# Patient Record
Sex: Male | Born: 1985 | Race: White | Hispanic: No | Marital: Single | Smoking: Current every day smoker
Health system: Southern US, Community
[De-identification: ages and names within clinical notes are randomized; demographics above are authoritative.]

## PROBLEM LIST (undated history)

## (undated) DIAGNOSIS — I10 Essential (primary) hypertension: Secondary | ICD-10-CM

## (undated) DIAGNOSIS — F410 Panic disorder [episodic paroxysmal anxiety] without agoraphobia: Secondary | ICD-10-CM

## (undated) DIAGNOSIS — F419 Anxiety disorder, unspecified: Secondary | ICD-10-CM

---

## 2020-10-30 ENCOUNTER — Other Ambulatory Visit: Payer: Self-pay

## 2020-10-30 ENCOUNTER — Emergency Department (HOSPITAL_BASED_OUTPATIENT_CLINIC_OR_DEPARTMENT_OTHER): Payer: 59 | Admitting: Radiology

## 2020-10-30 ENCOUNTER — Emergency Department (HOSPITAL_BASED_OUTPATIENT_CLINIC_OR_DEPARTMENT_OTHER)
Admission: EM | Admit: 2020-10-30 | Discharge: 2020-10-31 | Disposition: A | Discharge status: Home / Self Care | Payer: 59 | Attending: Emergency Medicine | Admitting: Emergency Medicine

## 2020-10-30 ENCOUNTER — Encounter (HOSPITAL_BASED_OUTPATIENT_CLINIC_OR_DEPARTMENT_OTHER): Payer: Self-pay

## 2020-10-30 DIAGNOSIS — F410 Panic disorder [episodic paroxysmal anxiety] without agoraphobia: Secondary | ICD-10-CM | POA: Diagnosis not present

## 2020-10-30 DIAGNOSIS — R0602 Shortness of breath: Secondary | ICD-10-CM

## 2020-10-30 DIAGNOSIS — R062 Wheezing: Secondary | ICD-10-CM

## 2020-10-30 DIAGNOSIS — F17200 Nicotine dependence, unspecified, uncomplicated: Secondary | ICD-10-CM | POA: Diagnosis not present

## 2020-10-30 DIAGNOSIS — R002 Palpitations: Secondary | ICD-10-CM

## 2020-10-30 HISTORY — DX: Panic disorder (episodic paroxysmal anxiety): F41.0

## 2020-10-30 LAB — CBC
HCT: 46.5 % (ref 39.0–52.0)
Hemoglobin: 15.2 g/dL (ref 13.0–17.0)
MCH: 27.9 pg (ref 26.0–34.0)
MCHC: 32.7 g/dL (ref 30.0–36.0)
MCV: 85.3 fL (ref 80.0–100.0)
Platelets: 339 10*3/uL (ref 150–400)
RBC: 5.45 MIL/uL (ref 4.22–5.81)
RDW: 13.2 % (ref 11.5–15.5)
WBC: 12 10*3/uL — ABNORMAL HIGH (ref 4.0–10.5)
nRBC: 0 % (ref 0.0–0.2)

## 2020-10-30 LAB — URINALYSIS, ROUTINE W REFLEX MICROSCOPIC
Bilirubin Urine: NEGATIVE
Glucose, UA: NEGATIVE mg/dL
Hgb urine dipstick: NEGATIVE
Ketones, ur: NEGATIVE mg/dL
Leukocytes,Ua: NEGATIVE
Nitrite: NEGATIVE
Protein, ur: NEGATIVE mg/dL
Specific Gravity, Urine: 1.017 (ref 1.005–1.030)
pH: 6.5 (ref 5.0–8.0)

## 2020-10-30 LAB — BASIC METABOLIC PANEL
Anion gap: 8 (ref 5–15)
BUN: 12 mg/dL (ref 6–20)
CO2: 28 mmol/L (ref 22–32)
Calcium: 9.1 mg/dL (ref 8.9–10.3)
Chloride: 99 mmol/L (ref 98–111)
Creatinine, Ser: 0.9 mg/dL (ref 0.61–1.24)
GFR, Estimated: >60 mL/min (ref >60)
Glucose, Bld: 112 mg/dL — ABNORMAL HIGH (ref 70–99)
Potassium: 3.8 mmol/L (ref 3.5–5.1)
Sodium: 135 mmol/L (ref 135–145)

## 2020-10-30 LAB — CBG MONITORING, ED: Glucose-Capillary: 118 mg/dL — ABNORMAL HIGH (ref 70–99)

## 2020-10-30 MED ORDER — AEROCHAMBER PLUS FLO-VU MEDIUM MISC
1.0000 | Freq: Once | Status: AC
  Administered 2020-10-30: 1
  Filled 2020-10-30: qty 1

## 2020-10-30 MED ORDER — ALBUTEROL SULFATE HFA 108 (90 BASE) MCG/ACT IN AERS
2.0000 | INHALATION_SPRAY | Freq: Once | RESPIRATORY_TRACT | Status: AC
  Administered 2020-10-30: 2 via RESPIRATORY_TRACT
  Filled 2020-10-30: qty 6.7

## 2020-10-30 MED ORDER — ALBUTEROL SULFATE HFA 108 (90 BASE) MCG/ACT IN AERS: 2.0000 | INHALATION_SPRAY | RESPIRATORY_TRACT | Status: DC | PRN

## 2020-10-30 MED ORDER — PREDNISONE 50 MG PO TABS
50.0000 mg | ORAL_TABLET | Freq: Once | ORAL | Status: AC
  Administered 2020-10-30: 50 mg via ORAL
  Filled 2020-10-30: qty 1

## 2020-10-30 NOTE — ED Triage Notes (Signed)
Patient here POV from Home with Dizziness and SOB.  Patient has had 2-3 episodes of Dizziness and SOB recently. Patient had one episode today that was worse than the others.  Episodes last approximately 1 HR. Nothing precipitating prior to Episodes.   BIB Wheelchair, NAD, GCS 15.

## 2020-10-30 NOTE — ED Provider Notes (Signed)
[facility] EMERGENCY DEPT Provider Note   CSN: [id number] Arrival date & time: 10/30/20  1922     History Chief Complaint  Patient presents with  . Dizziness  . Shortness of Breath    Dean Cooper is a 35 y.o. male.  The history is provided by the patient, medical records and a friend. No language interpreter was used.  Shortness of Breath Severity:  Moderate Onset quality:  Sudden Duration:  4 days Timing:  Sporadic Progression:  Waxing and waning Chronicity:  New Context: pollens and weather changes   Context: not URI   Relieved by:  Nothing Worsened by:  Nothing Associated symptoms: cough, headaches and wheezing   Associated symptoms: no abdominal pain, no chest pain, no claudication, no diaphoresis, no fever, no neck pain, no rash, no sputum production, no syncope and no vomiting   Risk factors: obesity   Risk factors: no hx of PE/DVT        Past Medical History:  Diagnosis Date  . Panic disorder (episodic paroxysmal anxiety)     There are no problems to display for this patient.   History reviewed. No pertinent surgical history.     No family history on file.  Social History   Tobacco Use  . Smoking status: Current Every Day Smoker    Packs/day: 1.00  . Smokeless tobacco: Never Used  Substance Use Topics  . Alcohol use: Not Currently  . Drug use: Not Currently    Home Medications Prior to Admission medications   Not on File    Allergies    Patient has no allergy information on record.  Review of Systems   Review of Systems  Constitutional: Negative for chills, diaphoresis and fever.  HENT: Negative for congestion.   Eyes: Negative for visual disturbance.  Respiratory: Positive for cough, chest tightness, shortness of breath and wheezing. Negative for sputum production and stridor.   Cardiovascular: Positive for palpitations. Negative for chest pain, claudication, leg swelling and syncope.  Gastrointestinal: Negative for  abdominal pain, constipation, diarrhea, nausea and vomiting.  Genitourinary: Negative for flank pain.  Musculoskeletal: Negative for back pain and neck pain.  Skin: Negative for rash and wound.  Neurological: Positive for dizziness, light-headedness and headaches. Negative for weakness.  Psychiatric/Behavioral: Positive for agitation. The patient is nervous/anxious.   All other systems reviewed and are negative.   Physical Exam Updated Vital Signs BP 139/68   Pulse 79   Temp 98.1 F (36.7 C) (Oral)   Resp 18   Ht 5\' 11"  (1.803 m)   Wt 127 kg   SpO2 98%   BMI 39.05 kg/m   Physical Exam Vitals and nursing note reviewed.  Constitutional:      General: He is not in acute distress.    Appearance: He is well-developed. He is not ill-appearing, toxic-appearing or diaphoretic.  HENT:     Head: Normocephalic and atraumatic.     Mouth/Throat:     Mouth: Mucous membranes are moist.     Pharynx: No pharyngeal swelling or oropharyngeal exudate.  Eyes:     Extraocular Movements: Extraocular movements intact.     Conjunctiva/sclera: Conjunctivae normal.     Pupils: Pupils are equal, round, and reactive to light.  Cardiovascular:     Rate and Rhythm: Normal rate and regular rhythm.  No extrasystoles are present.    Pulses: Normal pulses.     Heart sounds: No murmur heard.   Pulmonary:     Effort: Pulmonary effort is normal. No  tachypnea or respiratory distress.     Breath sounds: Wheezing present. No decreased breath sounds, rhonchi or rales.  Chest:     Chest wall: No tenderness.  Abdominal:     Palpations: Abdomen is soft.     Tenderness: There is no abdominal tenderness.  Musculoskeletal:     Cervical back: Neck supple.     Right lower leg: No tenderness. No edema.     Left lower leg: No tenderness. No edema.  Skin:    General: Skin is warm and dry.     Capillary Refill: Capillary refill takes less than 2 seconds.     Findings: No erythema.  Neurological:     General: No  focal deficit present.     Mental Status: He is alert.  Psychiatric:        Mood and Affect: Mood is anxious.     ED Results / Procedures / Treatments   Labs (all labs ordered are listed, but only abnormal results are displayed) Labs Reviewed  BASIC METABOLIC PANEL - Abnormal; Notable for the following components:      Result Value   Glucose, Bld 112 (*)    All other components within normal limits  CBC - Abnormal; Notable for the following components:   WBC 12.0 (*)    All other components within normal limits  CBG MONITORING, ED - Abnormal; Notable for the following components:   Glucose-Capillary 118 (*)    All other components within normal limits  URINALYSIS, ROUTINE W REFLEX MICROSCOPIC    EKG EKG Interpretation  Date/Time:  Thursday Oct 30 2020 20:03:46 EDT Ventricular Rate:  85 PR Interval:  156 QRS Duration: 96 QT Interval:  358 QTC Calculation: 426 R Axis:   84 Text Interpretation: Normal sinus rhythm Normal ECG No prior ECG for comparison. No STEMI Confirmed by Theda Belfast ([id number]) on 10/30/2020 8:21:51 PM   Radiology DG Chest 2 View  Result Date: 10/30/2020 CLINICAL DATA:  Shortness of breath EXAM: CHEST - 2 VIEW COMPARISON:  None. FINDINGS: The heart size and mediastinal contours are within normal limits. Both lungs are clear. The visualized skeletal structures are unremarkable. IMPRESSION: No active cardiopulmonary disease. Electronically Signed   By: Jasmine Pang M.D.   On: 10/30/2020 20:01    Procedures Procedures   Medications Ordered in ED Medications - No data to display  ED Course  I have reviewed the triage vital signs and the nursing notes.  Pertinent labs & imaging results that were available during my care of the patient were reviewed by me and considered in my medical decision making (see chart for details).    MDM Rules/Calculators/A&P                          Dean Cooper is a 35 y.o. male  with a past medical history significant  for remote history of asthma and and episodic paroxysmal anxiety/panic disorder who presents with episodes of wheezing, shortness of breath, and episodes of dizziness, lightheadedness, palpitations, and panic.  He reports that the symptoms of been ongoing for the last few days worsening and seem to be triggered by difficulty breathing.  He says that he thought he outgrew his asthma but with the seasonal changes has had worsened breathing and wheezing.  Denies any chest pain but does report some palpitations.  Reports feeling very anxious and having panic.  Reports some headaches at times but denies any focal neurologic deficits.  He reports  severe anxiety and panic and has not been on any medications for this.  He is scheduled to see his PCP in several weeks but was concerned he needed to be evaluated with these episodes.  He denies any syncope but reports the dizziness and lightheadedness has had him when he is very anxious and worked up with the breathing.  Denies any trauma.  Denies any neck pain or neck stiffness or other complaints.  On exam, lungs have wheezing and chest is nontender.  Abdomen and back are nontender.  No focal neurologic deficits.  Pupils symmetric and reactive normal extraocular movements.  No abnormalities with finger-nose-finger testing.  He is very anxious and jittery.  EKG shows no STEMI.  Vital signs reassuring.  Due to the wheezing, patient will be given some albuterol and steroids.  Clinically I suspect that his reactive airway disease history has been reengaged with the allergies and temperature changes over the last week or so.  I think that the breathing difficulties has caused him to have these episodes of panic attacks as he is worried about his breathing.  Patient reports he does not want any anxiety medicine in the emergency department.  Patient reassessed and is breathing and feeling much better.  He is no longer feeling anxious and his vital signs are reassuring.   Wheezing has resolved with the steroids and albuterol.  Patient will take the albuterol home and will give a prescription for a burst of steroids.  He will follow-up with his PCP in close follow-up and discuss further anxiety management.  Due to the panic attacks, will give a short prescription for Ativan in case he needs a rescue medication.  He otherwise well-appearing.  We offered CT head due to the dizziness and symptoms but have a low suspicion for intracranial abnormality.  Patient agrees to hold on imaging or further work-up at this time.  Given resolution of symptoms and well appearance for several hours, we feel he safe for discharge home.  Patient discharged in good condition with resolution of symptoms.     Final Clinical Impression(s) / ED Diagnoses Final diagnoses:  Shortness of breath  Wheezing  Anxiety attack  Palpitations    Rx / DC Orders ED Discharge Orders         Ordered    predniSONE (DELTASONE) 50 MG tablet  Daily with breakfast        10/31/20 0026    LORazepam (ATIVAN) 1 MG tablet  3 times daily PRN        10/31/20 0026          Clinical Impression: 1. Shortness of breath   2. Wheezing   3. Anxiety attack   4. Palpitations     Disposition: Discharge  Condition: Good  I have discussed the results, Dx and Tx plan with the pt(& family if present). He/she/they expressed understanding and agree(s) with the plan. Discharge instructions discussed at great length. Strict return precautions discussed and pt &/or family have verbalized understanding of the instructions. No further questions at time of discharge.    Discharge Medication List as of 10/31/2020 12:27 AM    START taking these medications   Details  LORazepam (ATIVAN) 1 MG tablet Take 1 tablet (1 mg total) by mouth 3 (three) times daily as needed for anxiety., Starting Fri 10/31/2020, Print    predniSONE (DELTASONE) 50 MG tablet Take 1 tablet (50 mg total) by mouth daily with breakfast for 4  days., Starting Fri 10/31/2020, Until Tue 11/04/2020,  Print        Follow Up: your PCP     [facility] [address] [place] [id number] [phone]       Almena Hokenson, Canary Brimhristopher J, MD 10/31/20 [phone]

## 2020-10-30 NOTE — ED Notes (Signed)
RT educated pt on proper use of aero chamber. Pt able to teach back to RT proper use of MDI/aero chamber without difficulty.

## 2020-10-31 MED ORDER — LORAZEPAM 1 MG PO TABS: 1.0000 mg | ORAL_TABLET | Freq: Three times a day (TID) | ORAL | 0 refills | Status: AC | PRN

## 2020-10-31 MED ORDER — PREDNISONE 50 MG PO TABS: 50.0000 mg | ORAL_TABLET | Freq: Every day | ORAL | 0 refills | Status: AC

## 2020-10-31 NOTE — Discharge Instructions (Signed)
Your history and exam today are consistent with some reactive airway disease likely asthma being exacerbated by the seasonal changes and pollens leading to anxiety and panic attacks.  Your exam was overall reassuring and after the albuterol and steroids your breathing has improved.  Given your stability for over 5 hours in the emergency department, we feel you are safe for discharge home and do not need further work-up at this time.  Please call your primary doctor tomorrow to discuss this close follow-up.  Please rest and stay hydrated.  Please use the anxiety medicine if you feel a panic attack coming on.  If any symptoms change or worsen acutely, please return to the nearest emergency department.

## 2020-11-19 ENCOUNTER — Emergency Department (HOSPITAL_BASED_OUTPATIENT_CLINIC_OR_DEPARTMENT_OTHER): Payer: 59

## 2020-11-19 ENCOUNTER — Other Ambulatory Visit: Payer: Self-pay

## 2020-11-19 ENCOUNTER — Inpatient Hospital Stay (HOSPITAL_BASED_OUTPATIENT_CLINIC_OR_DEPARTMENT_OTHER)
Admission: EM | Admit: 2020-11-19 | Discharge: 2020-11-23 | DRG: 872 | Disposition: A | Discharge status: Home / Self Care | Payer: 59 | Attending: Internal Medicine | Admitting: Internal Medicine

## 2020-11-19 ENCOUNTER — Encounter (HOSPITAL_BASED_OUTPATIENT_CLINIC_OR_DEPARTMENT_OTHER): Payer: Self-pay | Admitting: Emergency Medicine

## 2020-11-19 DIAGNOSIS — Z20822 Contact with and (suspected) exposure to covid-19: Secondary | ICD-10-CM | POA: Diagnosis present

## 2020-11-19 DIAGNOSIS — E861 Hypovolemia: Secondary | ICD-10-CM | POA: Diagnosis present

## 2020-11-19 DIAGNOSIS — R509 Fever, unspecified: Principal | ICD-10-CM

## 2020-11-19 DIAGNOSIS — Z79899 Other long term (current) drug therapy: Secondary | ICD-10-CM

## 2020-11-19 DIAGNOSIS — A419 Sepsis, unspecified organism: Secondary | ICD-10-CM | POA: Diagnosis not present

## 2020-11-19 DIAGNOSIS — Z833 Family history of diabetes mellitus: Secondary | ICD-10-CM

## 2020-11-19 DIAGNOSIS — F17210 Nicotine dependence, cigarettes, uncomplicated: Secondary | ICD-10-CM | POA: Diagnosis present

## 2020-11-19 DIAGNOSIS — Z6841 Body Mass Index (BMI) 40.0 and over, adult: Secondary | ICD-10-CM

## 2020-11-19 DIAGNOSIS — G039 Meningitis, unspecified: Secondary | ICD-10-CM

## 2020-11-19 DIAGNOSIS — E871 Hypo-osmolality and hyponatremia: Secondary | ICD-10-CM | POA: Diagnosis present

## 2020-11-19 DIAGNOSIS — R109 Unspecified abdominal pain: Secondary | ICD-10-CM | POA: Diagnosis present

## 2020-11-19 DIAGNOSIS — R197 Diarrhea, unspecified: Secondary | ICD-10-CM | POA: Diagnosis present

## 2020-11-19 DIAGNOSIS — E876 Hypokalemia: Secondary | ICD-10-CM | POA: Diagnosis present

## 2020-11-19 DIAGNOSIS — F32A Depression, unspecified: Secondary | ICD-10-CM | POA: Diagnosis present

## 2020-11-19 DIAGNOSIS — R519 Headache, unspecified: Secondary | ICD-10-CM

## 2020-11-19 DIAGNOSIS — F419 Anxiety disorder, unspecified: Secondary | ICD-10-CM | POA: Diagnosis present

## 2020-11-19 DIAGNOSIS — E669 Obesity, unspecified: Secondary | ICD-10-CM | POA: Diagnosis present

## 2020-11-19 DIAGNOSIS — R110 Nausea: Secondary | ICD-10-CM | POA: Diagnosis present

## 2020-11-19 HISTORY — DX: Anxiety disorder, unspecified: F41.9

## 2020-11-19 LAB — CBG MONITORING, ED: Glucose-Capillary: 114 mg/dL — ABNORMAL HIGH (ref 70–99)

## 2020-11-19 LAB — COMPREHENSIVE METABOLIC PANEL
ALT: 29 U/L (ref 0–44)
AST: 18 U/L (ref 15–41)
Albumin: 3.8 g/dL (ref 3.5–5.0)
Alkaline Phosphatase: 36 U/L — ABNORMAL LOW (ref 38–126)
Anion gap: 11 (ref 5–15)
BUN: 10 mg/dL (ref 6–20)
CO2: 23 mmol/L (ref 22–32)
Calcium: 8.4 mg/dL — ABNORMAL LOW (ref 8.9–10.3)
Chloride: 97 mmol/L — ABNORMAL LOW (ref 98–111)
Creatinine, Ser: 1.07 mg/dL (ref 0.61–1.24)
GFR, Estimated: >60 mL/min (ref >60)
Glucose, Bld: 102 mg/dL — ABNORMAL HIGH (ref 70–99)
Potassium: 3.4 mmol/L — ABNORMAL LOW (ref 3.5–5.1)
Sodium: 131 mmol/L — ABNORMAL LOW (ref 135–145)
Total Bilirubin: 1.1 mg/dL (ref 0.3–1.2)
Total Protein: 7.1 g/dL (ref 6.5–8.1)

## 2020-11-19 LAB — URINALYSIS, ROUTINE W REFLEX MICROSCOPIC
Bilirubin Urine: NEGATIVE
Glucose, UA: NEGATIVE mg/dL
Hgb urine dipstick: NEGATIVE
Ketones, ur: 15 mg/dL — AB
Leukocytes,Ua: NEGATIVE
Nitrite: NEGATIVE
Protein, ur: 30 mg/dL — AB
Specific Gravity, Urine: 1.023 (ref 1.005–1.030)
pH: 6.5 (ref 5.0–8.0)

## 2020-11-19 LAB — CBC WITH DIFFERENTIAL/PLATELET
Abs Immature Granulocytes: 0.1 10*3/uL — ABNORMAL HIGH (ref 0.00–0.07)
Basophils Absolute: 0 10*3/uL (ref 0.0–0.1)
Basophils Relative: 0 %
Eosinophils Absolute: 0 10*3/uL (ref 0.0–0.5)
Eosinophils Relative: 0 %
HCT: 41.8 % (ref 39.0–52.0)
Hemoglobin: 14.1 g/dL (ref 13.0–17.0)
Immature Granulocytes: 1 %
Lymphocytes Relative: 7 %
Lymphs Abs: 1.3 10*3/uL (ref 0.7–4.0)
MCH: 28.1 pg (ref 26.0–34.0)
MCHC: 33.7 g/dL (ref 30.0–36.0)
MCV: 83.3 fL (ref 80.0–100.0)
Monocytes Absolute: 1.5 10*3/uL — ABNORMAL HIGH (ref 0.1–1.0)
Monocytes Relative: 8 %
Neutro Abs: 16.4 10*3/uL — ABNORMAL HIGH (ref 1.7–7.7)
Neutrophils Relative %: 84 %
Platelets: 298 10*3/uL (ref 150–400)
RBC: 5.02 MIL/uL (ref 4.22–5.81)
RDW: 13.4 % (ref 11.5–15.5)
WBC: 19.2 10*3/uL — ABNORMAL HIGH (ref 4.0–10.5)
nRBC: 0 % (ref 0.0–0.2)

## 2020-11-19 LAB — RESP PANEL BY RT-PCR (FLU A&B, COVID) ARPGX2
Influenza A by PCR: NEGATIVE
Influenza B by PCR: NEGATIVE
SARS Coronavirus 2 by RT PCR: NEGATIVE

## 2020-11-19 LAB — LACTIC ACID, PLASMA: Lactic Acid, Venous: 0.6 mmol/L (ref 0.5–1.9)

## 2020-11-19 MED ORDER — KETOROLAC TROMETHAMINE 30 MG/ML IJ SOLN
30.0000 mg | Freq: Once | INTRAMUSCULAR | Status: AC
  Administered 2020-11-19: 30 mg via INTRAVENOUS
  Filled 2020-11-19: qty 1

## 2020-11-19 MED ORDER — SODIUM CHLORIDE 0.9 % IV SOLN
2.0000 g | Freq: Once | INTRAVENOUS | Status: AC
  Administered 2020-11-19: 2 g via INTRAVENOUS
  Filled 2020-11-19: qty 20

## 2020-11-19 MED ORDER — VANCOMYCIN HCL 1250 MG/250ML IV SOLN
1250.0000 mg | Freq: Three times a day (TID) | INTRAVENOUS | Status: DC
  Administered 2020-11-20 – 2020-11-21 (×5): 1250 mg via INTRAVENOUS
  Filled 2020-11-19 (×7): qty 250

## 2020-11-19 MED ORDER — VANCOMYCIN HCL IN DEXTROSE 1-5 GM/200ML-% IV SOLN
1000.0000 mg | Freq: Once | INTRAVENOUS | Status: AC
  Administered 2020-11-19: 1000 mg via INTRAVENOUS
  Filled 2020-11-19: qty 200

## 2020-11-19 MED ORDER — ONDANSETRON HCL 4 MG/2ML IJ SOLN
INTRAMUSCULAR | Status: AC
  Filled 2020-11-19: qty 2

## 2020-11-19 MED ORDER — SODIUM CHLORIDE 0.9 % IV BOLUS
500.0000 mL | Freq: Once | INTRAVENOUS | Status: AC
  Administered 2020-11-19: 500 mL via INTRAVENOUS

## 2020-11-19 MED ORDER — ONDANSETRON HCL 4 MG/2ML IJ SOLN
4.0000 mg | Freq: Once | INTRAMUSCULAR | Status: AC
  Administered 2020-11-19: 4 mg via INTRAVENOUS

## 2020-11-19 MED ORDER — IBUPROFEN 800 MG PO TABS
800.0000 mg | ORAL_TABLET | Freq: Once | ORAL | Status: AC
  Administered 2020-11-19: 800 mg via ORAL
  Filled 2020-11-19: qty 1

## 2020-11-19 MED ORDER — ACETAMINOPHEN 325 MG PO TABS
650.0000 mg | ORAL_TABLET | Freq: Once | ORAL | Status: AC | PRN
  Administered 2020-11-19: 650 mg via ORAL
  Filled 2020-11-19: qty 2

## 2020-11-19 MED ORDER — LORAZEPAM 2 MG/ML IJ SOLN
1.0000 mg | Freq: Once | INTRAMUSCULAR | Status: AC
  Administered 2020-11-19: 1 mg via INTRAVENOUS
  Filled 2020-11-19: qty 1

## 2020-11-19 NOTE — ED Provider Notes (Signed)
[facility] EMERGENCY DEPT Provider Note   CSN: [id number] Arrival date & time: 11/19/20  1514     History Chief Complaint  Patient presents with  . Fever    Dean Cooper is a 35 y.o. male w/ no significant past medical history presenting to ED with fever, chills, congestion, cough, body aches, diarrhea for 3 days.  It started on Monday with soreness in his upper back and lower neck.  It is progressed to a throbbing headache, diffuse cramping abdominal pain, change in taste, loss of appetite, diarrhea, and muscle cramping.  Reports home temp as high as 103F.  He is only taken Tylenol with some minimal relief at home for the symptoms.  There are no sick contacts in the house.  He reports he has had 3 doses of the COVID-vaccine.  He denies any other significant medical history aside from panic disorder and anxiety, for which he takes an SSRI.  No surgical history.  No foreign travel.  No sick contacts in house (lives with his partner).  HPI     Past Medical History:  Diagnosis Date  . Anxiety   . Panic disorder (episodic paroxysmal anxiety)     Patient Active Problem List   Diagnosis Date Noted  . Meningitis 11/19/2020    History reviewed. No pertinent surgical history.     History reviewed. No pertinent family history.  Social History   Tobacco Use  . Smoking status: Current Every Day Smoker    Packs/day: 1.00  . Smokeless tobacco: Never Used  Substance Use Topics  . Alcohol use: Not Currently  . Drug use: Not Currently    Home Medications Prior to Admission medications   Medication Sig Start Date End Date Taking? Authorizing Provider  escitalopram (LEXAPRO) 10 MG tablet Take 1 tablet by mouth daily. 11/10/20  Yes [provider]  LORazepam (ATIVAN) 1 MG tablet Take 1 tablet (1 mg total) by mouth 3 (three) times daily as needed for anxiety. 10/31/20  Yes Tegeler, Canary Brim, MD    Allergies    Patient has no known allergies.  Review of  Systems   Review of Systems  Constitutional: Positive for appetite change, chills, fatigue and fever.  HENT: Negative for ear pain and sore throat.   Eyes: Positive for pain and redness.  Respiratory: Positive for shortness of breath. Negative for cough.   Cardiovascular: Negative for chest pain and palpitations.  Gastrointestinal: Positive for abdominal pain, diarrhea and nausea. Negative for vomiting.  Genitourinary: Negative for dysuria and hematuria.  Musculoskeletal: Positive for arthralgias and myalgias.  Skin: Negative for color change and rash.  Neurological: Positive for headaches. Negative for syncope.  All other systems reviewed and are negative.   Physical Exam Updated Vital Signs BP 135/76   Pulse (!) 101   Temp 99.6 F (37.6 C) (Oral)   Resp 20   Ht 5\' 11"  (1.803 m)   Wt 131.5 kg   SpO2 94%   BMI 40.45 kg/m   Physical Exam Constitutional:      General: He is not in acute distress.    Appearance: He is obese.  HENT:     Head: Normocephalic and atraumatic.  Eyes:     Pupils: Pupils are equal, round, and reactive to light.     Comments: Mild injected scleral bilaterally  Neck:     Comments: Negative kernig & brudzinski signs Cardiovascular:     Rate and Rhythm: Regular rhythm. Tachycardia present.     Pulses: Normal  pulses.     Comments: HR 120 bpm Pulmonary:     Effort: Pulmonary effort is normal. No respiratory distress.  Abdominal:     General: There is no distension.     Palpations: There is no mass.     Tenderness: There is no abdominal tenderness. There is no guarding.  Musculoskeletal:     Cervical back: Normal range of motion and neck supple. No rigidity.  Skin:    General: Skin is warm and dry.  Neurological:     General: No focal deficit present.     Mental Status: He is alert and oriented to person, place, and time. Mental status is at baseline.  Psychiatric:        Mood and Affect: Mood normal.        Behavior: Behavior normal.      ED Results / Procedures / Treatments   Labs (all labs ordered are listed, but only abnormal results are displayed) Labs Reviewed  CBC WITH DIFFERENTIAL/PLATELET - Abnormal; Notable for the following components:      Result Value   WBC 19.2 (*)    Neutro Abs 16.4 (*)    Monocytes Absolute 1.5 (*)    Abs Immature Granulocytes 0.10 (*)    All other components within normal limits  COMPREHENSIVE METABOLIC PANEL - Abnormal; Notable for the following components:   Sodium 131 (*)    Potassium 3.4 (*)    Chloride 97 (*)    Glucose, Bld 102 (*)    Calcium 8.4 (*)    Alkaline Phosphatase 36 (*)    All other components within normal limits  URINALYSIS, ROUTINE W REFLEX MICROSCOPIC - Abnormal; Notable for the following components:   Ketones, ur 15 (*)    Protein, ur 30 (*)    All other components within normal limits  CBG MONITORING, ED - Abnormal; Notable for the following components:   Glucose-Capillary 114 (*)    All other components within normal limits  RESP PANEL BY RT-PCR (FLU A&B, COVID) ARPGX2  CULTURE, BLOOD (ROUTINE X 2)  CULTURE, BLOOD (ROUTINE X 2)  URINE CULTURE  CSF CULTURE W GRAM STAIN  GRAM STAIN  LACTIC ACID, PLASMA  CSF CELL COUNT WITH DIFFERENTIAL  CSF CELL COUNT WITH DIFFERENTIAL  GLUCOSE, CSF  PROTEIN, CSF  HSV 1/2 PCR, CSF    EKG EKG Interpretation  Date/Time:  Wednesday November 19 2020 17:06:54 EDT Ventricular Rate:  97 PR Interval:  147 QRS Duration: 111 QT Interval:  343 QTC Calculation: 436 R Axis:   87 Text Interpretation: Sinus rhythm RSR' in V1 or V2, right VCD or RVH Baseline wander in lead(s) II III aVF V4 V5 Confirmed by Alvester Chou [phone]) on 11/19/2020 5:09:03 PM   Radiology DG Chest Portable 1 View  Result Date: 11/19/2020 CLINICAL DATA:  Fever since Monday, congestion, chills, body aches, runny nose, diarrhea, abdominal pain, neck stiffness, T-max 103 degrees EXAM: PORTABLE CHEST 1 VIEW COMPARISON:  Portable exam 1600 hours  compared to 10/30/2020 FINDINGS: Normal heart size, mediastinal contours, and pulmonary vascularity. Lungs clear. No pulmonary infiltrate, pleural effusion, or pneumothorax. Osseous structures unremarkable. IMPRESSION: No acute abnormalities. Electronically Signed   By: Ulyses Southward M.D.   On: 11/19/2020 16:10    Procedures .Critical Care Performed by: Terald Sleeper, MD Authorized by: Terald Sleeper, MD   Critical care provider statement:    Critical care time (minutes):  45   Critical care was necessary to treat or prevent imminent or life-threatening deterioration  of the following conditions:  Sepsis   Critical care was time spent personally by me on the following activities:  Discussions with consultants, evaluation of patient's response to treatment, examination of patient, ordering and performing treatments and interventions, ordering and review of laboratory studies, ordering and review of radiographic studies, pulse oximetry, re-evaluation of patient's condition, obtaining history from patient or surrogate and review of old charts .Lumbar Puncture  Date/Time: 11/20/2020 12:14 AM Performed by: Terald Sleeper, MD Authorized by: Terald Sleeper, MD   Consent:    Consent obtained:  Verbal   Consent given by:  Patient   Risks, benefits, and alternatives were discussed: yes     Risks discussed:  Headache, nerve damage, repeat procedure, pain, infection and bleeding   Alternatives discussed:  Delayed treatment Universal protocol:    Procedure explained and questions answered to patient or proxy's satisfaction: yes     Relevant documents present and verified: yes     Test results available: yes     Imaging studies available: yes     Required blood products, implants, devices, and special equipment available: yes     Immediately prior to procedure a time out was called: yes     Site/side marked: yes     Patient identity confirmed:  Arm band Pre-procedure details:    Procedure  purpose:  Diagnostic   Preparation: Patient was prepped and draped in usual sterile fashion   Anesthesia:    Anesthesia method:  Local infiltration   Local anesthetic:  Lidocaine 1% w/o epi Procedure details:    Lumbar space:  L4-L5 interspace   Patient position:  Sitting   Needle gauge:  20   Needle length (in):  3.5   Ultrasound guidance: no     Number of attempts:  2 Post-procedure details:    Puncture site:  Adhesive bandage applied   Procedure completion:  Tolerated well, no immediate complications Comments:     Unable to obtain spinal fluid     Medications Ordered in ED Medications  vancomycin (VANCOREADY) IVPB 1250 mg/250 mL (has no administration in time range)  acetaminophen (TYLENOL) tablet 650 mg (650 mg Oral Given 11/19/20 1531)  ibuprofen (ADVIL) tablet 800 mg (800 mg Oral Given 11/19/20 1604)  sodium chloride 0.9 % bolus 500 mL (0 mLs Intravenous Stopped 11/19/20 1643)  LORazepam (ATIVAN) injection 1 mg (1 mg Intravenous Given 11/19/20 1940)  cefTRIAXone (ROCEPHIN) 2 g in sodium chloride 0.9 % 100 mL IVPB (0 g Intravenous Stopped 11/19/20 2100)  vancomycin (VANCOCIN) IVPB 1000 mg/200 mL premix (0 mg Intravenous Stopped 11/19/20 2205)    Followed by  vancomycin (VANCOCIN) IVPB 1000 mg/200 mL premix (1,000 mg Intravenous New Bag/Given 11/19/20 2205)  ondansetron (ZOFRAN) injection 4 mg (4 mg Intravenous Given 11/19/20 2112)  ketorolac (TORADOL) 30 MG/ML injection 30 mg (30 mg Intravenous Given 11/19/20 2217)    ED Course  I have reviewed the triage vital signs and the nursing notes.  Pertinent labs & imaging results that were available during my care of the patient were reviewed by me and considered in my medical decision making (see chart for details).   DDx includes viral illness vs bacterial infection vs other  His combination of symptoms suggested a viral infection.  I ordered covid/flu which returned negative.  Subsequently his las revealed WBC 19K with left shift.  Lactate  normal.    With fever, tachycardia, leukocytosis, I initiated a sepsis workup.  No abdominal complaints or tenderness to suggest abdominal source  of fever.  LFT's normal.  Doubt acute biliary disease at this time.  Dg chest and UA without evidence of infection.  His initial complaint was neck stiffness and fever at the start of symptoms.  He has a mild diffuse headache.  We discussed a workup for meningitis, and I attempted a bedside LP unsuccessfully.  I ordered IV vanco + rocephin empirically, and we will plan for medical admission.  He remains clinically well appearing at this time - no encephalopathy.    Doubt severe sepsis or septic shock. His headache improved with motrin but then returned, along with nausea and 1 episode vomiting.  IV zofran also given.   Dean Cooper was evaluated in Emergency Department on 11/20/2020 for the symptoms described in the history of present illness. He was evaluated in the context of the global COVID-19 pandemic, which necessitated consideration that the patient might be at risk for infection with the SARS-CoV-2 virus that causes COVID-19. Institutional protocols and algorithms that pertain to the evaluation of patients at risk for COVID-19 are in a state of rapid change based on information released by regulatory bodies including the CDC and federal and state organizations. These policies and algorithms were followed during the patient's care in the ED.  Clinical Course as of 11/20/20 0016  Wed Nov 19, 2020  1816 Lactic Acid, Venous: 0.6 [MT]  2021 Unsuccessful LP limited by body habitus - no CSF return on 2 attempts.  We will initiate IV antibiotics and admit for sepsis/meningitis evaluation, may need fluoroscopic LP at a later time.   [MT]  2104 Admitted to hospitalist.  I've spoken to Dr Manson PasseyBrown from radiology [facility] Porteous(Fluoro) on call tonight, who reports that [facility] and [facility] is emergent need for LP, their team will be able to assist.   Otherwise routine we will be happy to assist tomorrow morning with fluoroscopically guided LP.   [MT]    Clinical Course User Index [MT] Aiyana Stegmann, Kermit BaloMatthew J, MD    Final Clinical Impression(s) / ED Diagnoses Final diagnoses:  Fever, unspecified fever cause  Nonintractable headache, unspecified chronicity pattern, unspecified headache type    Rx / DC Orders ED Discharge Orders    None       Terald Sleeperrifan, Lyric Rossano J, MD 11/20/20 [phone]

## 2020-11-19 NOTE — ED Notes (Signed)
Pt has signed the procedural consent for LP.

## 2020-11-19 NOTE — ED Triage Notes (Signed)
Pt arrives to ED with c/o fever since Monday (x3 days). Pt reports congestion, chills, body aches, neck stiffness, runny nose, diarrhea, and abdominal pain. Highest recorded temp at home was 103. OTC tylenol w/o relief.

## 2020-11-19 NOTE — Progress Notes (Signed)
Pharmacy Antibiotic Note  Dean Cooper is a 35 y.o. male admitted on 11/19/2020 presenting with fever/chills, HA and concern for meningitis.  Pharmacy has been consulted for vancomycin dosing.  Ceftriaxone per MD  Plan: Vancomycin 2000 mg IV x 1, then 1250 mg IV q8h (target vancomycin trough 15-20) Monitor renal function, Cx/LP and meningitis workup Vancomycin levels as needed  Height: 5\' 11"  (180.3 cm) Weight: 131.5 kg (290 lb) IBW/kg (Calculated) : 75.3  Temp (24hrs), Avg:99.9 F (37.7 C), Min:98.6 F (37 C), Max:101.1 F (38.4 C)  Recent Labs  Lab 11/19/20 1613 11/19/20 1653  WBC 19.2*  --   CREATININE  --  1.07  LATICACIDVEN  --  0.6    Estimated Creatinine Clearance: 133.3 mL/min (by C-G formula based on SCr of 1.07 mg/dL).    No Known Allergies  01/19/21, PharmD Clinical Pharmacist ED Pharmacist Phone # [phone] 11/19/2020 9:10 PM

## 2020-11-19 NOTE — ED Notes (Signed)
Pt aware of the need for an LP. Explained procedure to pt who verbalizes understanding.

## 2020-11-20 ENCOUNTER — Observation Stay (HOSPITAL_COMMUNITY): Payer: 59

## 2020-11-20 DIAGNOSIS — Z20822 Contact with and (suspected) exposure to covid-19: Secondary | ICD-10-CM | POA: Diagnosis not present

## 2020-11-20 DIAGNOSIS — F17210 Nicotine dependence, cigarettes, uncomplicated: Secondary | ICD-10-CM | POA: Diagnosis not present

## 2020-11-20 DIAGNOSIS — R109 Unspecified abdominal pain: Secondary | ICD-10-CM | POA: Diagnosis not present

## 2020-11-20 DIAGNOSIS — R110 Nausea: Secondary | ICD-10-CM | POA: Diagnosis not present

## 2020-11-20 DIAGNOSIS — Z833 Family history of diabetes mellitus: Secondary | ICD-10-CM | POA: Diagnosis not present

## 2020-11-20 DIAGNOSIS — Z79899 Other long term (current) drug therapy: Secondary | ICD-10-CM | POA: Diagnosis not present

## 2020-11-20 DIAGNOSIS — E861 Hypovolemia: Secondary | ICD-10-CM | POA: Diagnosis not present

## 2020-11-20 DIAGNOSIS — G039 Meningitis, unspecified: Secondary | ICD-10-CM

## 2020-11-20 DIAGNOSIS — Z6841 Body Mass Index (BMI) 40.0 and over, adult: Secondary | ICD-10-CM | POA: Diagnosis not present

## 2020-11-20 DIAGNOSIS — R509 Fever, unspecified: Secondary | ICD-10-CM | POA: Diagnosis present

## 2020-11-20 DIAGNOSIS — F419 Anxiety disorder, unspecified: Secondary | ICD-10-CM | POA: Diagnosis not present

## 2020-11-20 DIAGNOSIS — R197 Diarrhea, unspecified: Secondary | ICD-10-CM | POA: Diagnosis not present

## 2020-11-20 DIAGNOSIS — E871 Hypo-osmolality and hyponatremia: Secondary | ICD-10-CM | POA: Diagnosis not present

## 2020-11-20 DIAGNOSIS — F32A Depression, unspecified: Secondary | ICD-10-CM | POA: Diagnosis not present

## 2020-11-20 DIAGNOSIS — A419 Sepsis, unspecified organism: Secondary | ICD-10-CM | POA: Diagnosis not present

## 2020-11-20 DIAGNOSIS — E669 Obesity, unspecified: Secondary | ICD-10-CM | POA: Diagnosis not present

## 2020-11-20 DIAGNOSIS — E876 Hypokalemia: Secondary | ICD-10-CM | POA: Diagnosis not present

## 2020-11-20 DIAGNOSIS — R519 Headache, unspecified: Secondary | ICD-10-CM

## 2020-11-20 LAB — COMPREHENSIVE METABOLIC PANEL
ALT: 33 U/L (ref 0–44)
AST: 19 U/L (ref 15–41)
Albumin: 3.1 g/dL — ABNORMAL LOW (ref 3.5–5.0)
Alkaline Phosphatase: 61 U/L (ref 38–126)
Anion gap: 8 (ref 5–15)
BUN: 10 mg/dL (ref 6–20)
CO2: 28 mmol/L (ref 22–32)
Calcium: 8.5 mg/dL — ABNORMAL LOW (ref 8.9–10.3)
Chloride: 97 mmol/L — ABNORMAL LOW (ref 98–111)
Creatinine, Ser: 1.26 mg/dL — ABNORMAL HIGH (ref 0.61–1.24)
GFR, Estimated: >60 mL/min (ref >60)
Glucose, Bld: 104 mg/dL — ABNORMAL HIGH (ref 70–99)
Potassium: 3.6 mmol/L (ref 3.5–5.1)
Sodium: 133 mmol/L — ABNORMAL LOW (ref 135–145)
Total Bilirubin: 1.2 mg/dL (ref 0.3–1.2)
Total Protein: 7.3 g/dL (ref 6.5–8.1)

## 2020-11-20 LAB — PROTEIN, CSF: Total  Protein, CSF: 27 mg/dL (ref 15–45)

## 2020-11-20 LAB — CSF CELL COUNT WITH DIFFERENTIAL
RBC Count, CSF: 33 /mm3 — ABNORMAL HIGH
Tube #: 3
WBC, CSF: 3 /mm3 (ref 0–5)

## 2020-11-20 LAB — CBC
HCT: 43.4 % (ref 39.0–52.0)
Hemoglobin: 14 g/dL (ref 13.0–17.0)
MCH: 27.8 pg (ref 26.0–34.0)
MCHC: 32.3 g/dL (ref 30.0–36.0)
MCV: 86.1 fL (ref 80.0–100.0)
Platelets: 282 10*3/uL (ref 150–400)
RBC: 5.04 MIL/uL (ref 4.22–5.81)
RDW: 13.3 % (ref 11.5–15.5)
WBC: 16.4 10*3/uL — ABNORMAL HIGH (ref 4.0–10.5)
nRBC: 0 % (ref 0.0–0.2)

## 2020-11-20 LAB — PHOSPHORUS: Phosphorus: 3.2 mg/dL (ref 2.5–4.6)

## 2020-11-20 LAB — GLUCOSE, CSF: Glucose, CSF: 65 mg/dL (ref 40–70)

## 2020-11-20 LAB — MAGNESIUM: Magnesium: 2.1 mg/dL (ref 1.7–2.4)

## 2020-11-20 MED ORDER — BACITRACIN-POLYMYXIN B 500-10000 UNIT/GM OP OINT
TOPICAL_OINTMENT | Freq: Every day | OPHTHALMIC | Status: DC
  Filled 2020-11-20: qty 3.5

## 2020-11-20 MED ORDER — ACETAMINOPHEN 325 MG PO TABS
650.0000 mg | ORAL_TABLET | Freq: Once | ORAL | Status: AC | PRN
  Administered 2020-11-20: 650 mg via ORAL

## 2020-11-20 MED ORDER — LORAZEPAM 0.5 MG PO TABS: 0.5000 mg | ORAL_TABLET | Freq: Three times a day (TID) | ORAL | Status: DC | PRN

## 2020-11-20 MED ORDER — IBUPROFEN 600 MG PO TABS
600.0000 mg | ORAL_TABLET | Freq: Four times a day (QID) | ORAL | Status: DC | PRN
  Administered 2020-11-20 – 2020-11-22 (×6): 600 mg via ORAL
  Filled 2020-11-20 (×7): qty 1

## 2020-11-20 MED ORDER — DEXAMETHASONE SODIUM PHOSPHATE 4 MG/ML IJ SOLN
4.0000 mg | Freq: Two times a day (BID) | INTRAMUSCULAR | Status: DC
  Administered 2020-11-20 – 2020-11-22 (×5): 4 mg via INTRAVENOUS
  Filled 2020-11-20 (×5): qty 1

## 2020-11-20 MED ORDER — ESCITALOPRAM OXALATE 10 MG PO TABS
10.0000 mg | ORAL_TABLET | Freq: Every day | ORAL | Status: DC
  Administered 2020-11-20 – 2020-11-23 (×4): 10 mg via ORAL
  Filled 2020-11-20 (×4): qty 1

## 2020-11-20 MED ORDER — SODIUM CHLORIDE 0.9 % IV SOLN
2.0000 g | Freq: Two times a day (BID) | INTRAVENOUS | Status: DC
  Administered 2020-11-20 – 2020-11-23 (×7): 2 g via INTRAVENOUS
  Filled 2020-11-20 (×2): qty 2
  Filled 2020-11-20: qty 20
  Filled 2020-11-20 (×2): qty 2
  Filled 2020-11-20: qty 20
  Filled 2020-11-20: qty 2
  Filled 2020-11-20 (×3): qty 20

## 2020-11-20 MED ORDER — POTASSIUM CHLORIDE 2 MEQ/ML IV SOLN
INTRAVENOUS | Status: AC
  Filled 2020-11-20 (×2): qty 1000

## 2020-11-20 MED ORDER — LIDOCAINE HCL (PF) 1 % IJ SOLN
5.0000 mL | Freq: Once | INTRAMUSCULAR | Status: AC
  Administered 2020-11-20: 5 mL via INTRADERMAL

## 2020-11-20 MED ORDER — ACETAMINOPHEN 325 MG PO TABS
ORAL_TABLET | ORAL | Status: AC
  Filled 2020-11-20: qty 2

## 2020-11-20 NOTE — ED Notes (Signed)
Called patient placement to check on bed... s/w Brandi and she advised the level of care has to be change--messaging Dr. Martyn Malay

## 2020-11-20 NOTE — Progress Notes (Signed)
PROGRESS NOTE    Dean Cooper  ZOX:[id number] DOB: May 22, 1986 DOA: 11/19/2020 PCP: System, Provider Not In    Brief Narrative:  35 y.o. male with medical history significant for obesity, chronic anxiety/depression, asthma, who [facility] with a 3-day history of headache, fever, and neck stiffness.  Associated with congestion, cough, body aches, diarrhea.  Denies photophobia or any recent head trauma.  He lives with his boyfriend who does not have these symptoms.  He presented to the ED for further evaluation.  While in the ED, patient was found to have a fever with T-max 103.2, he was tachycardic, with leukocytosis of 19,000.  COVID-19 screening test was negative.  UA negative for pyuria.  Chest x-ray nonacute.  EDP had concern for bacterial meningitis, therefore attempted lumbar puncture but was unsuccessful.  Was started on IV antibiotics empirically and placed on droplet isolation by EDP.  EDP discussed case with interventional radiology Dr. Manson PasseyBrown who is willing to perform a lumbar puncture [facility]  [facility] was [facility].  Assessment & Plan:   Active Problems:   Meningitis  Headache and neck stiffness with concern for bacterial meningitis -CT w/o contrast reviewed, unremarkable -LP performed by IR reviewed findings, thus far unremarkable -Cultures pending -Had been continued on empiric abx -Cont with analgesia as tolerated  Sepsis secondary to presumptive meningitis -Presented with leukocytosis, tachycardia, suspected meningitis Was continued on broad-spectrum IV antibiotics, IV vancomycin and Rocephin. -WBC trending down  Electrolytes derangement Replaced -Mg level normal  Hypovolemic hyponatremia Gentle IV fluid hydration with improvement in Na  Chronic anxiety/depression Resumed home regimen   DVT prophylaxis: SCD's Code Status: Full Family Communication: Pt in room, family not at  bedside  Status is: Observation  The patient remains OBS appropriate and will d/c before 2 midnights.  Dispo: The patient is from: Home              Anticipated d/c is to: Home              Patient currently is not medically stable to d/c.   Difficult to place patient No       Consultants:   IR  Procedures:   LP 6/2  Antimicrobials: Anti-infectives (From admission, onward)   Start     Dose/Rate Route Frequency Ordered Stop   11/20/20 0800  cefTRIAXone (ROCEPHIN) 2 g in sodium chloride 0.9 % 100 mL IVPB        2 g 200 mL/hr over 30 Minutes Intravenous Every 12 hours 11/20/20 0634     11/20/20 0700  vancomycin (VANCOREADY) IVPB 1250 mg/250 mL        1,250 mg 166.7 mL/hr over 90 Minutes Intravenous Every 8 hours 11/19/20 2114     11/19/20 2200  vancomycin (VANCOCIN) IVPB 1000 mg/200 mL premix       "Followed by" Linked Group Details   1,000 mg 200 mL/hr over 60 Minutes Intravenous  Once 11/19/20 2050 11/19/20 2305   11/19/20 2100  vancomycin (VANCOCIN) IVPB 1000 mg/200 mL premix       "Followed by" Linked Group Details   1,000 mg 200 mL/hr over 60 Minutes Intravenous  Once 11/19/20 2050 11/19/20 2205   11/19/20 2030  cefTRIAXone (ROCEPHIN) 2 g in sodium chloride 0.9 % 100 mL IVPB        2 g 200 mL/hr over 30 Minutes Intravenous  Once 11/19/20 2021 11/19/20 2100  Subjective: Complaining of residual headaches, post LP  Objective: Vitals:   11/20/20 0056 11/20/20 0131 11/20/20 0626 11/20/20 1627  BP:  (!) 158/90 (!) 168/80 (!) 147/77  Pulse:  (!) 103 90 98  Resp:  20 18 15   Temp: (!) 103 F (39.4 C) (!) 103.2 F (39.6 C) 99.1 F (37.3 C) 99.9 F (37.7 C)  TempSrc: Oral Oral Oral   SpO2:  95% 93% 97%  Weight:      Height:        Intake/Output Summary (Last 24 hours) at 11/20/2020 1654 Last data filed at 11/20/2020 0800 Gross per 24 hour  Intake 0 ml  Output 1440 ml  Net -1440 ml   Filed Weights   11/19/20 1523  Weight: 131.5 kg     Examination: General exam: Awake, laying in bed, in nad Respiratory system: Normal respiratory effort, no wheezing Cardiovascular system: regular rate, s1, s2 Gastrointestinal system: Soft, nondistended, positive BS Central nervous system: CN2-12 grossly intact, strength intact Extremities: Perfused, no clubbing Skin: Normal skin turgor, no notable skin lesions seen Psychiatry: Mood normal // no visual hallucinations   Data Reviewed: I have personally reviewed following labs and imaging studies  CBC: Recent Labs  Lab 11/19/20 1613 11/20/20 0700  WBC 19.2* 16.4*  NEUTROABS 16.4*  --   HGB 14.1 14.0  HCT 41.8 43.4  MCV 83.3 86.1  PLT 298 282   Basic Metabolic Panel: Recent Labs  Lab 11/19/20 1653 11/20/20 0700  NA 131* 133*  K 3.4* 3.6  CL 97* 97*  CO2 23 28  GLUCOSE 102* 104*  BUN 10 10  CREATININE 1.07 1.26*  CALCIUM 8.4* 8.5*  MG  --  2.1  PHOS  --  3.2   GFR: Estimated Creatinine Clearance: 113.2 mL/min (A) (by C-G formula based on SCr of 1.26 mg/dL (H)). Liver Function Tests: Recent Labs  Lab 11/19/20 1653 11/20/20 0700  AST 18 19  ALT 29 33  ALKPHOS 36* 61  BILITOT 1.1 1.2  PROT 7.1 7.3  ALBUMIN 3.8 3.1*   No results for input(s): LIPASE, AMYLASE in the last 168 hours. No results for input(s): AMMONIA in the last 168 hours. Coagulation Profile: No results for input(s): INR, PROTIME in the last 168 hours. Cardiac Enzymes: No results for input(s): CKTOTAL, CKMB, CKMBINDEX, TROPONINI in the last 168 hours. BNP (last 3 results) No results for input(s): PROBNP in the last 8760 hours. HbA1C: No results for input(s): HGBA1C in the last 72 hours. CBG: Recent Labs  Lab 11/19/20 1743  GLUCAP 114*   Lipid Profile: No results for input(s): CHOL, HDL, LDLCALC, TRIG, CHOLHDL, LDLDIRECT in the last 72 hours. Thyroid Function Tests: No results for input(s): TSH, T4TOTAL, FREET4, T3FREE, THYROIDAB in the last 72 hours. Anemia Panel: No results for  input(s): VITAMINB12, FOLATE, FERRITIN, TIBC, IRON, RETICCTPCT in the last 72 hours. Sepsis Labs: Recent Labs  Lab 11/19/20 1653  LATICACIDVEN 0.6    Recent Results (from the past 240 hour(s))  Resp Panel by RT-PCR (Flu A&B, Covid) Nasopharyngeal Swab     Status: None   Collection Time: 11/19/20  3:20 PM   Specimen: Nasopharyngeal Swab; Nasopharyngeal(NP) swabs in vial transport medium  Result Value Ref Range Status   SARS Coronavirus 2 by RT PCR NEGATIVE NEGATIVE Final    Comment: (NOTE) SARS-CoV-2 target nucleic acids are NOT DETECTED.  The SARS-CoV-2 RNA is generally detectable in upper respiratory specimens during the acute phase of infection. The lowest concentration of SARS-CoV-2 viral copies  this assay can detect is 138 copies/mL. A negative result does not preclude SARS-Cov-2 infection and should not be used as the sole basis for treatment or other patient management decisions. A negative result may occur with  improper specimen collection/handling, submission of specimen other than nasopharyngeal swab, presence of viral mutation(s) within the areas targeted by this assay, and inadequate number of viral copies(<138 copies/mL). A negative result must be combined with clinical observations, patient history, and epidemiological information. The expected result is Negative.  Fact Sheet for Patients:  BloggerCourse.com  Fact Sheet for Healthcare Providers:  SeriousBroker.it  This test is no t yet approved or cleared by the [place] FDA and  has been authorized for detection and/or diagnosis of SARS-CoV-2 by FDA under an Emergency Use Authorization (EUA). This EUA will remain  in effect (meaning this test can be used) for the duration of the COVID-19 declaration under Section 564(b)(1) of the Act, 21 U.S.C.section 360bbb-3(b)(1), unless the authorization is terminated  or revoked sooner.       Influenza A by PCR  NEGATIVE NEGATIVE Final   Influenza B by PCR NEGATIVE NEGATIVE Final    Comment: (NOTE) The Xpert Xpress SARS-CoV-2/FLU/RSV plus assay is intended as an aid in the diagnosis of influenza from Nasopharyngeal swab specimens and should not be used as a sole basis for treatment. Nasal washings and aspirates are unacceptable for Xpert Xpress SARS-CoV-2/FLU/RSV testing.  Fact Sheet for Patients: BloggerCourse.com  Fact Sheet for Healthcare Providers: SeriousBroker.it  This test is not yet approved or cleared by the [place] FDA and has been authorized for detection and/or diagnosis of SARS-CoV-2 by FDA under an Emergency Use Authorization (EUA). This EUA will remain in effect (meaning this test can be used) for the duration of the COVID-19 declaration under Section 564(b)(1) of the Act, 21 U.S.C. section 360bbb-3(b)(1), unless the authorization is terminated or revoked.  Performed at [facility], [address] [place], [place] [address]   Blood Culture (routine x 2)     Status: None (Preliminary result)   Collection Time: 11/19/20  4:55 PM   Specimen: BLOOD  Result Value Ref Range Status   Specimen Description   Final    BLOOD Performed at [facility], [address] [place], [place] [address]    Special Requests   Final    BOTTLES DRAWN AEROBIC AND ANAEROBIC Blood Culture adequate volume BLOOD LEFT HAND Performed at [facility], [address] [place], [place] [address]    Culture   Final    NO GROWTH < 12 HOURS Performed at [facility], [address], [place], [place] [address]    Report Status PENDING  Incomplete  Blood Culture (routine x 2)     Status: None (Preliminary result)   Collection Time: 11/19/20  5:03 PM   Specimen: BLOOD  Result Value Ref Range Status   Specimen Description   Final    BLOOD Performed at [facility], [address] [place], [place] [address]    Special Requests   Final    BOTTLES DRAWN AEROBIC AND ANAEROBIC Blood Culture adequate volume RIGHT ANTECUBITAL Performed at [facility], [address], [place], [place] [address]    Culture   Final    NO GROWTH < 12 HOURS Performed at [facility], [address], [place], [place] [address]    Report Status PENDING  Incomplete     Radiology Studies: CT HEAD WO CONTRAST  Result Date: 11/20/2020 CLINICAL DATA:  Altered mental status cause. EXAM: CT HEAD WITHOUT CONTRAST TECHNIQUE: Contiguous axial images were obtained from the base of the skull through the vertex without intravenous contrast. COMPARISON:  None. FINDINGS: Brain: No evidence of acute infarction, hemorrhage, hydrocephalus, extra-axial collection or mass lesion/mass effect. A partial empty sella is noted, unusual patient's age. Vascular: No hyperdense vessel or unexpected calcification. Skull: Normal. Negative for fracture or focal lesion. Sinuses/Orbits: No acute finding. IMPRESSION: 1. No acute intracranial abnormality. 2. Partial empty sella. Electronically Signed   By: Baldemar Lenis M.D.   On: 11/20/2020 08:50   DG Chest Portable 1 View  Result Date: 11/19/2020 CLINICAL DATA:  Fever since Monday, congestion, chills, body aches, runny nose, diarrhea, abdominal pain, neck stiffness, T-max 103 degrees EXAM: PORTABLE CHEST 1 VIEW COMPARISON:  Portable exam 1600 hours compared to 10/30/2020 FINDINGS: Normal heart size, mediastinal contours, and pulmonary vascularity. Lungs clear. No pulmonary infiltrate, pleural effusion, or pneumothorax. Osseous structures unremarkable. IMPRESSION: No acute abnormalities. Electronically Signed   By: Ulyses Southward M.D.   On: 11/19/2020 16:10   DG FL GUIDED LUMBAR PUNCTURE  Result Date: 11/20/2020 CLINICAL DATA:  Fever, possible meningitis EXAM: DIAGNOSTIC LUMBAR PUNCTURE UNDER FLUOROSCOPIC GUIDANCE  COMPARISON:  None FLUOROSCOPY TIME:  Fluoroscopy Time:  18 seconds Radiation Exposure Index (if provided by the fluoroscopic device): 5 mGy PROCEDURE: Informed consent was obtained from the patient prior to the procedure, including potential complications of headache, allergy, and pain. With the patient prone, the lower back was prepped with Betadine. 1% Lidocaine was used for local anesthesia. Lumbar puncture was performed at the L3-L4 level using a 20 gauge needle with return of clear CSF. 9 mL of CSF were obtained for laboratory studies. The patient tolerated the procedure well and there were no apparent complications. IMPRESSION: Technically successful fluoroscopic guided lumbar puncture. Electronically Signed   By: Guadlupe Spanish M.D.   On: 11/20/2020 09:53    Scheduled Meds: . bacitracin-polymyxin b (ophth)   Both Eyes QHS  . dexamethasone (DECADRON) injection  4 mg Intravenous Q12H  . escitalopram  10 mg Oral Daily   Continuous Infusions: . cefTRIAXone (ROCEPHIN)  IV 2 g (11/20/20 0959)  . lactated ringers with kcl 50 mL/hr at 11/20/20 0958  . vancomycin 1,250 mg (11/20/20 1414)     LOS: 0 days   Rickey Barbara, MD [facility] Pager On Amion  If 7PM-7AM, please contact night-coverage 11/20/2020, 4:54 PM

## 2020-11-20 NOTE — Procedures (Signed)
Lumbar Puncture Procedure Note  DEREN DEGRAZIA  [id number]  12-29-1985  Date:11/20/20  Time:9:22 AM   Provider Performing:Jasan Doughtie   Procedure: Fluoroscopic guided lumbar puncture  Indication(s) Rule out meningitis  Consent Risks of the procedure as well as the alternatives and risks of each were explained to the patient and/or caregiver.  Consent for the procedure was obtained and is signed in the bedside chart  Anesthesia Topical only with 1% lidocaine    Time Out Verified patient identification, verified procedure, site/side was marked, verified correct patient position, special equipment/implants available, medications/allergies/relevant history reviewed, required imaging and test results available.   Sterile Technique Maximal sterile technique including sterile barrier drape, hand hygiene, sterile gown, sterile gloves, mask, hair covering.    Procedure Description See report in PACS. Fluoroscopy used to identify appropriate needle trajectory.   Lidocaine used to anesthetize skin and subcutaneous tissue overlying this area.  A 20g spinal needle was then used to access the subarachnoid space.  9 mL CSF obtained.  Complications/Tolerance None; patient tolerated the procedure well.   EBL Minimal   Specimen(s) CSF

## 2020-11-20 NOTE — ED Notes (Signed)
Called [facility] to transport patient to [facility] Rm#1

## 2020-11-20 NOTE — H&P (Addendum)
History and Physical  DE JAWORSKI IEP:[id number] DOB: 10-Jan-1986 DOA: 11/19/2020  Referring physician: Patient accepted by Dr. Martyn Malay [facility] as a direct admission from [facility] ED. PCP: System, Provider Not In  Outpatient Specialists: None. Patient coming from: Direct admission from [facility] ED.  Chief Complaint: Headache, fever, and neck stiffness x3 days  HPI: Dean Cooper is a 35 y.o. male with medical history significant for obesity, chronic anxiety/depression, asthma, who initially presented to [facility] ED with a 3-day history of headache, fever, and neck stiffness.  Associated with congestion, cough, body aches, diarrhea.  Denies photophobia or any recent head trauma.  He lives with his boyfriend who does not have these symptoms.  He presented to the ED for further evaluation.  While in the ED, patient was found to have a fever with T-max 103.2, he was tachycardic, with leukocytosis of 19,000.  COVID-19 screening test was negative.  UA negative for pyuria.  Chest x-ray nonacute.  EDP had concern for bacterial meningitis, therefore attempted lumbar puncture but was unsuccessful.  Was started on IV antibiotics empirically and placed on droplet isolation by EDP.  EDP discussed case with interventional radiology Dr. Manson Passey who is willing to perform a lumbar puncture when the patient arrives to [facility].  [facility], hospitalist team, was asked to admit.  ED Course:  T-max 103.2.  BP 158/90, pulse 103, respiration rate 20.  Lab studies remarkable for serum sodium 131, potassium 3.4, serum glucose 102, BUN 10, creatinine 1.07, alkaline phosphatase 36.  WBC 19.2.  Neutrophil count 16.4.  EKG sinus rhythm rate of 97.  Nonspecific ST-T changes.  QTc 436.  Review of Systems: Review of systems as noted in the HPI. All other systems reviewed and are negative.   Past Medical History:  Diagnosis Date  . Anxiety   . Panic disorder (episodic paroxysmal anxiety)    History reviewed. No pertinent  surgical history.  Social History:  reports that he has been smoking. He has been smoking about 1.00 pack per day. He has never used smokeless tobacco. He reports previous alcohol use. He reports previous drug use.   No Known Allergies  Family history: Mother with diabetes.  Prior to Admission medications   Medication Sig Start Date End Date Taking? Authorizing Provider  escitalopram (LEXAPRO) 10 MG tablet Take 1 tablet by mouth daily. 11/10/20  Yes [provider]  LORazepam (ATIVAN) 1 MG tablet Take 1 tablet (1 mg total) by mouth 3 (three) times daily as needed for anxiety. 10/31/20  Yes Tegeler, Canary Brim, MD    Physical Exam: BP (!) 158/90 (BP Location: Left Arm)   Pulse (!) 114   Temp (!) 103.2 F (39.6 C) (Oral)   Resp 20   Ht 5\' 11"  (1.803 m)   Wt 131.5 kg   SpO2 95%   BMI 40.45 kg/m   . General: 35 y.o. year-old male well developed well nourished in no acute distress.  Alert and oriented x3. . Cardiovascular: Regular rate and rhythm with no rubs or gallops.  No thyromegaly or JVD noted.  No lower extremity edema. 2/4 pulses in all 4 extremities. 31 Respiratory: Clear to auscultation with no wheezes or rales. Good inspiratory effort. . Abdomen: Soft nontender nondistended with normal bowel sounds x4 quadrants. . Muskuloskeletal: No cyanosis, clubbing or edema noted bilaterally . Neuro: CN II-XII intact, strength, sensation, reflexes.  Neck stiffness with flexion. . Skin: No ulcerative lesions noted or rashes . Psychiatry: Judgement and insight appear normal. Mood is  appropriate for condition and setting          Labs on Admission:  Basic Metabolic Panel: Recent Labs  Lab 11/19/20 1653  NA 131*  K 3.4*  CL 97*  CO2 23  GLUCOSE 102*  BUN 10  CREATININE 1.07  CALCIUM 8.4*   Liver Function Tests: Recent Labs  Lab 11/19/20 1653  AST 18  ALT 29  ALKPHOS 36*  BILITOT 1.1  PROT 7.1  ALBUMIN 3.8   No results for input(s): LIPASE, AMYLASE in the  last 168 hours. No results for input(s): AMMONIA in the last 168 hours. CBC: Recent Labs  Lab 11/19/20 1613  WBC 19.2*  NEUTROABS 16.4*  HGB 14.1  HCT 41.8  MCV 83.3  PLT 298   Cardiac Enzymes: No results for input(s): CKTOTAL, CKMB, CKMBINDEX, TROPONINI in the last 168 hours.  BNP (last 3 results) No results for input(s): BNP in the last 8760 hours.  ProBNP (last 3 results) No results for input(s): PROBNP in the last 8760 hours.  CBG: Recent Labs  Lab 11/19/20 1743  GLUCAP 114*    Radiological Exams on Admission: DG Chest Portable 1 View  Result Date: 11/19/2020 CLINICAL DATA:  Fever since Monday, congestion, chills, body aches, runny nose, diarrhea, abdominal pain, neck stiffness, T-max 103 degrees EXAM: PORTABLE CHEST 1 VIEW COMPARISON:  Portable exam 1600 hours compared to 10/30/2020 FINDINGS: Normal heart size, mediastinal contours, and pulmonary vascularity. Lungs clear. No pulmonary infiltrate, pleural effusion, or pneumothorax. Osseous structures unremarkable. IMPRESSION: No acute abnormalities. Electronically Signed   By: Ulyses Southward M.D.   On: 11/19/2020 16:10    EKG: I independently viewed the EKG done and my findings are as followed: Sinus rhythm rate of 97.  Nonspecific ST-T changes.  QTc 436.  Assessment/Plan Present on Admission: . Meningitis  Active Problems:   Meningitis  Headache and neck stiffness with concern for bacterial meningitis LP attempted in the ED at [facility] unsuccessfully EDP discussed case with IR who agreed to do a lumbar puncture at [facility]. IR officially consulted for LP. Started on IV antibiotics empirically, IV vancomycin and Rocephin in the ED, on droplet precautions. Blood cultures x2 in process, follow results. Add 2 g Rocephin daily as well as IV Decadron. Follow LP fluid analysis and culture. Pain control.  Sepsis secondary to presumptive meningitis Presented with leukocytosis, tachycardia, suspected meningitis Continue  broad-spectrum IV antibiotics, IV vancomycin and Rocephin. Monitor fever curve and WBC. Follow blood cultures. Gentle IV fluid hydration  Electrolytes derangement Hypokalemia 3.4, replete Recheck level in the morning Obtain magnesium level  Hypovolemic hyponatremia Serum sodium 131 Gentle IV fluid hydration.  Chronic anxiety/depression Resume home regimen    DVT prophylaxis: SCDs.  Code Status: Full code.  Family Communication: None at bedside.  Disposition Plan: Admitted as a direct admission to MedSurg unit as observation status by Dr. Martyn Malay, [facility].  Consults called: Interventional radiology.  Admission status: Observation status.   Status is: Observation    Dispo: The patient is from: Home.              Anticipated d/c is to: Home once symptomatology has improved..              Patient currently not stable for discharge due to ongoing management of possible meningitis.   Difficult to place patient, not applicable.       Darlin Drop MD [facility] Pager [phone]  If 7PM-7AM, please contact night-coverage [email or web address] Password [email or web address]  11/20/2020, 2:22 AM

## 2020-11-21 DIAGNOSIS — F419 Anxiety disorder, unspecified: Secondary | ICD-10-CM | POA: Diagnosis present

## 2020-11-21 DIAGNOSIS — E669 Obesity, unspecified: Secondary | ICD-10-CM | POA: Diagnosis present

## 2020-11-21 DIAGNOSIS — F17210 Nicotine dependence, cigarettes, uncomplicated: Secondary | ICD-10-CM | POA: Diagnosis present

## 2020-11-21 DIAGNOSIS — R110 Nausea: Secondary | ICD-10-CM | POA: Diagnosis present

## 2020-11-21 DIAGNOSIS — F32A Depression, unspecified: Secondary | ICD-10-CM | POA: Diagnosis present

## 2020-11-21 DIAGNOSIS — A419 Sepsis, unspecified organism: Secondary | ICD-10-CM | POA: Diagnosis present

## 2020-11-21 DIAGNOSIS — Z833 Family history of diabetes mellitus: Secondary | ICD-10-CM | POA: Diagnosis not present

## 2020-11-21 DIAGNOSIS — R509 Fever, unspecified: Secondary | ICD-10-CM | POA: Diagnosis present

## 2020-11-21 DIAGNOSIS — R109 Unspecified abdominal pain: Secondary | ICD-10-CM | POA: Diagnosis present

## 2020-11-21 DIAGNOSIS — E861 Hypovolemia: Secondary | ICD-10-CM | POA: Diagnosis present

## 2020-11-21 DIAGNOSIS — E871 Hypo-osmolality and hyponatremia: Secondary | ICD-10-CM | POA: Diagnosis present

## 2020-11-21 DIAGNOSIS — Z79899 Other long term (current) drug therapy: Secondary | ICD-10-CM | POA: Diagnosis not present

## 2020-11-21 DIAGNOSIS — Z20822 Contact with and (suspected) exposure to covid-19: Secondary | ICD-10-CM | POA: Diagnosis present

## 2020-11-21 DIAGNOSIS — R197 Diarrhea, unspecified: Secondary | ICD-10-CM | POA: Diagnosis present

## 2020-11-21 DIAGNOSIS — E876 Hypokalemia: Secondary | ICD-10-CM | POA: Diagnosis present

## 2020-11-21 DIAGNOSIS — R519 Headache, unspecified: Secondary | ICD-10-CM | POA: Diagnosis not present

## 2020-11-21 DIAGNOSIS — G039 Meningitis, unspecified: Secondary | ICD-10-CM | POA: Diagnosis not present

## 2020-11-21 DIAGNOSIS — Z6841 Body Mass Index (BMI) 40.0 and over, adult: Secondary | ICD-10-CM | POA: Diagnosis not present

## 2020-11-21 LAB — URINE CULTURE: Culture: NO GROWTH

## 2020-11-21 LAB — COMPREHENSIVE METABOLIC PANEL
ALT: 31 U/L (ref 0–44)
AST: 18 U/L (ref 15–41)
Albumin: 2.7 g/dL — ABNORMAL LOW (ref 3.5–5.0)
Alkaline Phosphatase: 49 U/L (ref 38–126)
Anion gap: 10 (ref 5–15)
BUN: 15 mg/dL (ref 6–20)
CO2: 23 mmol/L (ref 22–32)
Calcium: 8.4 mg/dL — ABNORMAL LOW (ref 8.9–10.3)
Chloride: 99 mmol/L (ref 98–111)
Creatinine, Ser: 1.13 mg/dL (ref 0.61–1.24)
GFR, Estimated: >60 mL/min (ref >60)
Glucose, Bld: 143 mg/dL — ABNORMAL HIGH (ref 70–99)
Potassium: 4 mmol/L (ref 3.5–5.1)
Sodium: 132 mmol/L — ABNORMAL LOW (ref 135–145)
Total Bilirubin: 0.6 mg/dL (ref 0.3–1.2)
Total Protein: 6.9 g/dL (ref 6.5–8.1)

## 2020-11-21 LAB — CBC
HCT: 41.7 % (ref 39.0–52.0)
Hemoglobin: 13.7 g/dL (ref 13.0–17.0)
MCH: 28 pg (ref 26.0–34.0)
MCHC: 32.9 g/dL (ref 30.0–36.0)
MCV: 85.1 fL (ref 80.0–100.0)
Platelets: 315 10*3/uL (ref 150–400)
RBC: 4.9 MIL/uL (ref 4.22–5.81)
RDW: 13.2 % (ref 11.5–15.5)
WBC: 17.2 10*3/uL — ABNORMAL HIGH (ref 4.0–10.5)
nRBC: 0 % (ref 0.0–0.2)

## 2020-11-21 LAB — VANCOMYCIN, TROUGH: Vancomycin Tr: 10 ug/mL — ABNORMAL LOW (ref 15–20)

## 2020-11-21 MED ORDER — VANCOMYCIN HCL 1500 MG/300ML IV SOLN
1500.0000 mg | Freq: Three times a day (TID) | INTRAVENOUS | Status: DC
  Administered 2020-11-21 – 2020-11-22 (×3): 1500 mg via INTRAVENOUS
  Filled 2020-11-21 (×4): qty 300

## 2020-11-21 NOTE — Plan of Care (Signed)
Will remain free from infection:Pt continues iv abt and all hand washing and safety protocols in place.

## 2020-11-21 NOTE — Progress Notes (Signed)
CSW met with pt regarding PCP.  Pt moved to [place] from [place] last year, had been seeing Dr Derrill Kay there.  He is aware of a local PCP and is planning to get established here.  Aware that he can call [employer] if he needs help with locating in network provider. Lurline Idol, MSW, LCSW 6/3/20229:47 AM

## 2020-11-21 NOTE — Progress Notes (Signed)
PROGRESS NOTE    Dean Cooper  GYF:[id number] DOB: 1985-10-13 DOA: 11/19/2020 PCP: System, Provider Not In    Brief Narrative:  35 y.o. male with medical history significant for obesity, chronic anxiety/depression, asthma, who initially presented to [facility] ED with a 3-day history of headache, fever, and neck stiffness.  Associated with congestion, cough, body aches, diarrhea.  Denies photophobia or any recent head trauma.  He lives with his boyfriend who does not have these symptoms.  He presented to the ED for further evaluation.  While in the ED, patient was found to have a fever with T-max 103.2, he was tachycardic, with leukocytosis of 19,000.  COVID-19 screening test was negative.  UA negative for pyuria.  Chest x-ray nonacute.  EDP had concern for bacterial meningitis, therefore attempted lumbar puncture but was unsuccessful.  Was started on IV antibiotics empirically and placed on droplet isolation by EDP.  EDP discussed case with interventional radiology Dr. Manson Passey who is willing to perform a lumbar puncture when the patient arrives to [facility].  [facility], hospitalist team, was asked to admit.  Assessment & Plan:   Active Problems:   Meningitis  Headache and neck stiffness with concern for bacterial meningitis -CT w/o contrast reviewed, unremarkable -LP performed by IR reviewed findings, thus far unremarkable -Cultures thus far pan-negative -Had been continued on empiric abx -Cont with analgesia as tolerated -Fever trends are improving  Sepsis secondary to presumptive meningitis -Presented with leukocytosis, tachycardia, suspected meningitis Was continued on broad-spectrum IV antibiotics, IV vancomycin and Rocephin. -WBC stable  Electrolytes derangement Replaced -Mg level normal  Hypovolemic hyponatremia Gentle IV fluid hydration with improvement in Na -recheck bmet in AM  Chronic anxiety/depression Resumed home regimen   DVT prophylaxis: SCD's Code  Status: Full Family Communication: Pt in room, family not at bedside  Status is: Observation  The patient will require care spanning > 2 midnights and should be moved to inpatient because: Inpatient level of care appropriate due to severity of illness  Dispo: The patient is from: Home              Anticipated d/c is to: Home              Patient currently is not medically stable to d/c.   Difficult to place patient No  Consultants:   IR  Procedures:   LP 6/2  Antimicrobials: Anti-infectives (From admission, onward)   Start     Dose/Rate Route Frequency Ordered Stop   11/21/20 2200  vancomycin (VANCOREADY) IVPB 1500 mg/300 mL        1,500 mg 150 mL/hr over 120 Minutes Intravenous Every 8 hours 11/21/20 1609     11/20/20 0800  cefTRIAXone (ROCEPHIN) 2 g in sodium chloride 0.9 % 100 mL IVPB        2 g 200 mL/hr over 30 Minutes Intravenous Every 12 hours 11/20/20 0634     11/20/20 0700  vancomycin (VANCOREADY) IVPB 1250 mg/250 mL  Status:  Discontinued        1,250 mg 166.7 mL/hr over 90 Minutes Intravenous Every 8 hours 11/19/20 2114 11/21/20 1609   11/19/20 2200  vancomycin (VANCOCIN) IVPB 1000 mg/200 mL premix       "Followed by" Linked Group Details   1,000 mg 200 mL/hr over 60 Minutes Intravenous  Once 11/19/20 2050 11/19/20 2305   11/19/20 2100  vancomycin (VANCOCIN) IVPB 1000 mg/200 mL premix       "Followed by" Linked Group Details   1,000 mg  200 mL/hr over 60 Minutes Intravenous  Once 11/19/20 2050 11/19/20 2205   11/19/20 2030  cefTRIAXone (ROCEPHIN) 2 g in sodium chloride 0.9 % 100 mL IVPB        2 g 200 mL/hr over 30 Minutes Intravenous  Once 11/19/20 2021 11/19/20 2100      Subjective: Fever overnight, improving  Objective: Vitals:   11/20/20 2207 11/21/20 0628 11/21/20 0758 11/21/20 1617  BP: (!) 150/86 (!) 163/94 140/83 122/78  Pulse: 100 74 74 79  Resp: 20  16 17   Temp: 100 F (37.8 C) 98.7 F (37.1 C)  99.1 F (37.3 C)  TempSrc:  Oral    SpO2:  99% 97%  98%  Weight:      Height:        Intake/Output Summary (Last 24 hours) at 11/21/2020 1753 Last data filed at 11/20/2020 1800 Gross per 24 hour  Intake 885.58 ml  Output --  Net 885.58 ml   Filed Weights   11/19/20 1523  Weight: 131.5 kg    Examination: General exam: Awake, laying in bed, in nad Respiratory system: Normal respiratory effort, no wheezing Cardiovascular system: regular rate, s1, s2 Gastrointestinal system: Soft, nondistended, positive BS Central nervous system: CN2-12 grossly intact, strength intact Extremities: Perfused, no clubbing Skin: Normal skin turgor, no notable skin lesions seen Psychiatry: Mood normal // no visual hallucinations   Data Reviewed: I have personally reviewed following labs and imaging studies  CBC: Recent Labs  Lab 11/19/20 1613 11/20/20 0700 11/21/20 0248  WBC 19.2* 16.4* 17.2*  NEUTROABS 16.4*  --   --   HGB 14.1 14.0 13.7  HCT 41.8 43.4 41.7  MCV 83.3 86.1 85.1  PLT 298 282 315   Basic Metabolic Panel: Recent Labs  Lab 11/19/20 1653 11/20/20 0700 11/21/20 0248  NA 131* 133* 132*  K 3.4* 3.6 4.0  CL 97* 97* 99  CO2 23 28 23   GLUCOSE 102* 104* 143*  BUN 10 10 15   CREATININE 1.07 1.26* 1.13  CALCIUM 8.4* 8.5* 8.4*  MG  --  2.1  --   PHOS  --  3.2  --    GFR: Estimated Creatinine Clearance: 126.2 mL/min (by C-G formula based on SCr of 1.13 mg/dL). Liver Function Tests: Recent Labs  Lab 11/19/20 1653 11/20/20 0700 11/21/20 0248  AST 18 19 18   ALT 29 33 31  ALKPHOS 36* 61 49  BILITOT 1.1 1.2 0.6  PROT 7.1 7.3 6.9  ALBUMIN 3.8 3.1* 2.7*   No results for input(s): LIPASE, AMYLASE in the last 168 hours. No results for input(s): AMMONIA in the last 168 hours. Coagulation Profile: No results for input(s): INR, PROTIME in the last 168 hours. Cardiac Enzymes: No results for input(s): CKTOTAL, CKMB, CKMBINDEX, TROPONINI in the last 168 hours. BNP (last 3 results) No results for input(s): PROBNP in the  last 8760 hours. HbA1C: No results for input(s): HGBA1C in the last 72 hours. CBG: Recent Labs  Lab 11/19/20 1743  GLUCAP 114*   Lipid Profile: No results for input(s): CHOL, HDL, LDLCALC, TRIG, CHOLHDL, LDLDIRECT in the last 72 hours. Thyroid Function Tests: No results for input(s): TSH, T4TOTAL, FREET4, T3FREE, THYROIDAB in the last 72 hours. Anemia Panel: No results for input(s): VITAMINB12, FOLATE, FERRITIN, TIBC, IRON, RETICCTPCT in the last 72 hours. Sepsis Labs: Recent Labs  Lab 11/19/20 1653  LATICACIDVEN 0.6    Recent Results (from the past 240 hour(s))  Resp Panel by RT-PCR (Flu A&B, Covid) Nasopharyngeal Swab  Status: None   Collection Time: 11/19/20  3:20 PM   Specimen: Nasopharyngeal Swab; Nasopharyngeal(NP) swabs in vial transport medium  Result Value Ref Range Status   SARS Coronavirus 2 by RT PCR NEGATIVE NEGATIVE Final    Comment: (NOTE) SARS-CoV-2 target nucleic acids are NOT DETECTED.  The SARS-CoV-2 RNA is generally detectable in upper respiratory specimens during the acute phase of infection. The lowest concentration of SARS-CoV-2 viral copies this assay can detect is 138 copies/mL. A negative result does not preclude SARS-Cov-2 infection and should not be used as the sole basis for treatment or other patient management decisions. A negative result may occur with  improper specimen collection/handling, submission of specimen other than nasopharyngeal swab, presence of viral mutation(s) within the areas targeted by this assay, and inadequate number of viral copies(<138 copies/mL). A negative result must be combined with clinical observations, patient history, and epidemiological information. The expected result is Negative.  Fact Sheet for Patients:  BloggerCourse.com  Fact Sheet for Healthcare Providers:  SeriousBroker.it  This test is no t yet approved or cleared by the [place] FDA and   has been authorized for detection and/or diagnosis of SARS-CoV-2 by FDA under an Emergency Use Authorization (EUA). This EUA will remain  in effect (meaning this test can be used) for the duration of the COVID-19 declaration under Section 564(b)(1) of the Act, 21 U.S.C.section 360bbb-3(b)(1), unless the authorization is terminated  or revoked sooner.       Influenza A by PCR NEGATIVE NEGATIVE Final   Influenza B by PCR NEGATIVE NEGATIVE Final    Comment: (NOTE) The Xpert Xpress SARS-CoV-2/FLU/RSV plus assay is intended as an aid in the diagnosis of influenza from Nasopharyngeal swab specimens and should not be used as a sole basis for treatment. Nasal washings and aspirates are unacceptable for Xpert Xpress SARS-CoV-2/FLU/RSV testing.  Fact Sheet for Patients: BloggerCourse.com  Fact Sheet for Healthcare Providers: SeriousBroker.it  This test is not yet approved or cleared by the [place] FDA and has been authorized for detection and/or diagnosis of SARS-CoV-2 by FDA under an Emergency Use Authorization (EUA). This EUA will remain in effect (meaning this test can be used) for the duration of the COVID-19 declaration under Section 564(b)(1) of the Act, 21 U.S.C. section 360bbb-3(b)(1), unless the authorization is terminated or revoked.  Performed at [facility], [address] [place], [place] [address]   Blood Culture (routine x 2)     Status: None (Preliminary result)   Collection Time: 11/19/20  4:55 PM   Specimen: BLOOD  Result Value Ref Range Status   Specimen Description   Final    BLOOD Performed at [facility], [address] [place], [place] [address]    Special Requests   Final    BOTTLES DRAWN AEROBIC AND ANAEROBIC Blood Culture adequate volume BLOOD LEFT HAND Performed at [facility], [address] [place], [place] [address]    Culture    Final    NO GROWTH 2 DAYS Performed at [facility], [address], [place], [place] [address]    Report Status PENDING  Incomplete  Blood Culture (routine x 2)     Status: None (Preliminary result)   Collection Time: 11/19/20  5:03 PM   Specimen: BLOOD  Result Value Ref Range Status   Specimen Description   Final    BLOOD Performed at [facility], [address], [place], [place] [address]    Special Requests   Final  BOTTLES DRAWN AEROBIC AND ANAEROBIC Blood Culture adequate volume RIGHT ANTECUBITAL Performed at [facility], [address], [place], [place] [address]    Culture   Final    NO GROWTH 2 DAYS Performed at [facility], [address], [place], [place] [address]    Report Status PENDING  Incomplete  Urine culture     Status: None   Collection Time: 11/19/20  5:15 PM   Specimen: In/Out Cath Urine  Result Value Ref Range Status   Specimen Description   Final    IN/OUT CATH URINE Performed at [facility], [address] [place], [place] [address]    Special Requests   Final    NONE Performed at [facility], [address] [place], [place] [address]    Culture   Final    NO GROWTH Performed at [facility], [address] [place], [place] [address]    Report Status 11/21/2020 FINAL  Final  CSF culture w Gram Stain     Status: None (Preliminary result)   Collection Time: 11/20/20  9:24 AM   Specimen: CSF  Result Value Ref Range Status   Specimen Description CSF  Final   Special Requests NONE  Final   Gram Stain CYTOSPIN SMEAR NO WBC SEEN NO ORGANISMS SEEN   Final   Culture   Final    NO GROWTH < 24 HOURS Performed at [facility], [address], [place], [place] [address]    Report Status PENDING  Incomplete     Radiology Studies: CT HEAD WO CONTRAST  Result Date: 11/20/2020 CLINICAL DATA:  Altered mental status cause. EXAM: CT HEAD  WITHOUT CONTRAST TECHNIQUE: Contiguous axial images were obtained from the base of the skull through the vertex without intravenous contrast. COMPARISON:  None. FINDINGS: Brain: No evidence of acute infarction, hemorrhage, hydrocephalus, extra-axial collection or mass lesion/mass effect. A partial empty sella is noted, unusual patient's age. Vascular: No hyperdense vessel or unexpected calcification. Skull: Normal. Negative for fracture or focal lesion. Sinuses/Orbits: No acute finding. IMPRESSION: 1. No acute intracranial abnormality. 2. Partial empty sella. Electronically Signed   By: Baldemar Lenis M.D.   On: 11/20/2020 08:50   DG FL GUIDED LUMBAR PUNCTURE  Result Date: 11/20/2020 CLINICAL DATA:  Fever, possible meningitis EXAM: DIAGNOSTIC LUMBAR PUNCTURE UNDER FLUOROSCOPIC GUIDANCE COMPARISON:  None FLUOROSCOPY TIME:  Fluoroscopy Time:  18 seconds Radiation Exposure Index (if provided by the fluoroscopic device): 5 mGy PROCEDURE: Informed consent was obtained from the patient prior to the procedure, including potential complications of headache, allergy, and pain. With the patient prone, the lower back was prepped with Betadine. 1% Lidocaine was used for local anesthesia. Lumbar puncture was performed at the L3-L4 level using a 20 gauge needle with return of clear CSF. 9 mL of CSF were obtained for laboratory studies. The patient tolerated the procedure well and there were no apparent complications. IMPRESSION: Technically successful fluoroscopic guided lumbar puncture. Electronically Signed   By: Guadlupe Spanish M.D.   On: 11/20/2020 09:53    Scheduled Meds: . bacitracin-polymyxin b   Both Eyes QHS  . dexamethasone (DECADRON) injection  4 mg Intravenous Q12H  . escitalopram  10 mg Oral Daily   Continuous Infusions: . cefTRIAXone (ROCEPHIN)  IV 2 g (11/21/20 5784)  . vancomycin       LOS: 0 days   Rickey Barbara, MD [facility] Pager On Amion  If 7PM-7AM, please contact  night-coverage  11/21/2020, 5:53 PM

## 2020-11-21 NOTE — Progress Notes (Signed)
Pharmacy Antibiotic Note  Dean Cooper is a 35 y.o. male admitted on 11/19/2020 presenting with fever/chills, HA and concern for meningitis.  Pharmacy has been consulted for vancomycin dosing.  Ceftriaxone per MD.  Vancomycin trough subtherapeutic at 10 mcg/ml. Renal function stable.  Plan: Increase vancomycin to 1500mg  IV q8h Follow repeat VT at new Css  Height: 5\' 11"  (180.3 cm) Weight: 131.5 kg (290 lb) IBW/kg (Calculated) : 75.3  Temp (24hrs), Avg:99.5 F (37.5 C), Min:98.7 F (37.1 C), Max:100 F (37.8 C)  Recent Labs  Lab 11/19/20 1613 11/19/20 1653 11/20/20 0700 11/21/20 0248 11/21/20 1306  WBC 19.2*  --  16.4* 17.2*  --   CREATININE  --  1.07 1.26* 1.13  --   LATICACIDVEN  --  0.6  --   --   --   VANCOTROUGH  --   --   --   --  10*    Estimated Creatinine Clearance: 126.2 mL/min (by C-G formula based on SCr of 1.13 mg/dL).    No Known Allergies  01/21/21, PharmD, BCPS, [facility] Clinical Pharmacist [phone] Please check AMION for all [facility] Pharmacy numbers 11/21/2020

## 2020-11-22 DIAGNOSIS — G039 Meningitis, unspecified: Secondary | ICD-10-CM | POA: Diagnosis not present

## 2020-11-22 DIAGNOSIS — R519 Headache, unspecified: Secondary | ICD-10-CM | POA: Diagnosis not present

## 2020-11-22 LAB — COMPREHENSIVE METABOLIC PANEL
ALT: 52 U/L — ABNORMAL HIGH (ref 0–44)
AST: 36 U/L (ref 15–41)
Albumin: 2.7 g/dL — ABNORMAL LOW (ref 3.5–5.0)
Alkaline Phosphatase: 57 U/L (ref 38–126)
Anion gap: 10 (ref 5–15)
BUN: 12 mg/dL (ref 6–20)
CO2: 25 mmol/L (ref 22–32)
Calcium: 9 mg/dL (ref 8.9–10.3)
Chloride: 99 mmol/L (ref 98–111)
Creatinine, Ser: 1.07 mg/dL (ref 0.61–1.24)
GFR, Estimated: >60 mL/min (ref >60)
Glucose, Bld: 105 mg/dL — ABNORMAL HIGH (ref 70–99)
Potassium: 4 mmol/L (ref 3.5–5.1)
Sodium: 134 mmol/L — ABNORMAL LOW (ref 135–145)
Total Bilirubin: 0.5 mg/dL (ref 0.3–1.2)
Total Protein: 7 g/dL (ref 6.5–8.1)

## 2020-11-22 LAB — CBC
HCT: 41 % (ref 39.0–52.0)
Hemoglobin: 13.5 g/dL (ref 13.0–17.0)
MCH: 28 pg (ref 26.0–34.0)
MCHC: 32.9 g/dL (ref 30.0–36.0)
MCV: 85.1 fL (ref 80.0–100.0)
Platelets: 344 10*3/uL (ref 150–400)
RBC: 4.82 MIL/uL (ref 4.22–5.81)
RDW: 13.2 % (ref 11.5–15.5)
WBC: 18.1 10*3/uL — ABNORMAL HIGH (ref 4.0–10.5)
nRBC: 0 % (ref 0.0–0.2)

## 2020-11-22 NOTE — Progress Notes (Signed)
PROGRESS NOTE    Dean Cooper  ESP:[id number] DOB: 1986/03/02 DOA: 11/19/2020 PCP: System, Provider Not In    Brief Narrative:  35 y.o. male with medical history significant for obesity, chronic anxiety/depression, asthma, who initially presented to [facility] ED with a 3-day history of headache, fever, and neck stiffness.  Associated with congestion, cough, body aches, diarrhea.  Denies photophobia or any recent head trauma.  He lives with his boyfriend who does not have these symptoms.  He presented to the ED for further evaluation.  While in the ED, patient was found to have a fever with T-max 103.2, he was tachycardic, with leukocytosis of 19,000.  COVID-19 screening test was negative.  UA negative for pyuria.  Chest x-ray nonacute.  EDP had concern for bacterial meningitis, therefore attempted lumbar puncture but was unsuccessful.  Was started on IV antibiotics empirically and placed on droplet isolation by EDP.  EDP discussed case with interventional radiology Dr. Manson Passey who is willing to perform a lumbar puncture when the patient arrives to [facility].  [facility], hospitalist team, was asked to admit.  Assessment & Plan:   Active Problems:   Meningitis   Sepsis (HCC)  Headache and neck stiffness, meningitis ruled out -CT w/o contrast reviewed, unremarkable -LP performed by IR reviewed findings, thus far unremarkable -Cultures thus far pan-negative -Had been continued on empiric abx, vanc and rocephin -symptoms are improving -occasional fevers, however fever trends are improving -Had discussed case with ID. -Given pan-negative cultures, will begin narrowing and weaning regimen. Would hold further steroids and vanc today -Will check HIV in AM.   Sepsis, unclear etiology, meningitis ruled out -Presented with leukocytosis, tachycardia, suspected meningitis Was continued on broad-spectrum IV antibiotics, IV vancomycin and Rocephin. -WBC stable  Electrolytes  derangement Replaced -Cont to follow lytes  Hypovolemic hyponatremia Gentle IV fluid hydration with improvement in Na -repeat bmet in AM  Chronic anxiety/depression Continued on home regimen  DVT prophylaxis: SCD's Code Status: Full Family Communication: Pt in room, family not at bedside  Status is: Inpatient   Inpatient level of care appropriate due to severity of illness  Dispo: The patient is from: Home              Anticipated d/c is to: Home              Patient currently is not medically stable to d/c.   Difficult to place patient No  Consultants:   IR  Procedures:   LP 6/2  Antimicrobials: Anti-infectives (From admission, onward)   Start     Dose/Rate Route Frequency Ordered Stop   11/21/20 2200  vancomycin (VANCOREADY) IVPB 1500 mg/300 mL  Status:  Discontinued        1,500 mg 150 mL/hr over 120 Minutes Intravenous Every 8 hours 11/21/20 1609 11/22/20 1534   11/20/20 0800  cefTRIAXone (ROCEPHIN) 2 g in sodium chloride 0.9 % 100 mL IVPB        2 g 200 mL/hr over 30 Minutes Intravenous Every 12 hours 11/20/20 0634     11/20/20 0700  vancomycin (VANCOREADY) IVPB 1250 mg/250 mL  Status:  Discontinued        1,250 mg 166.7 mL/hr over 90 Minutes Intravenous Every 8 hours 11/19/20 2114 11/21/20 1609   11/19/20 2200  vancomycin (VANCOCIN) IVPB 1000 mg/200 mL premix       "Followed by" Linked Group Details   1,000 mg 200 mL/hr over 60 Minutes Intravenous  Once 11/19/20 2050 11/19/20 2305   11/19/20  2100  vancomycin (VANCOCIN) IVPB 1000 mg/200 mL premix       "Followed by" Linked Group Details   1,000 mg 200 mL/hr over 60 Minutes Intravenous  Once 11/19/20 2050 11/19/20 2205   11/19/20 2030  cefTRIAXone (ROCEPHIN) 2 g in sodium chloride 0.9 % 100 mL IVPB        2 g 200 mL/hr over 30 Minutes Intravenous  Once 11/19/20 2021 11/19/20 2100      Subjective: Reports neck/back discomfort is much improved, still having fevers  Objective: Vitals:   11/21/20 2300  11/22/20 0906 11/22/20 1059 11/22/20 1333  BP:   138/80 (!) 141/81  Pulse:   84 67  Resp:   17 20  Temp: 98.7 F (37.1 C) (!) 101.4 F (38.6 C) 99.3 F (37.4 C) 98 F (36.7 C)  TempSrc: Oral Axillary Oral Oral  SpO2:   96% 96%  Weight:      Height:        Intake/Output Summary (Last 24 hours) at 11/22/2020 1534 Last data filed at 11/22/2020 1500 Gross per 24 hour  Intake --  Output 4550 ml  Net -4550 ml   Filed Weights   11/19/20 1523  Weight: 131.5 kg    Examination: General exam: Conversant, in no acute distress Respiratory system: normal chest rise, clear, no audible wheezing Cardiovascular system: regular rhythm, s1-s2 Gastrointestinal system: Nondistended, nontender, pos BS Central nervous system: No seizures, no tremors Extremities: No cyanosis, no joint deformities Skin: No rashes, no pallor Psychiatry: Affect normal // no auditory hallucinations   Data Reviewed: I have personally reviewed following labs and imaging studies  CBC: Recent Labs  Lab 11/19/20 1613 11/20/20 0700 11/21/20 0248 11/22/20 0154  WBC 19.2* 16.4* 17.2* 18.1*  NEUTROABS 16.4*  --   --   --   HGB 14.1 14.0 13.7 13.5  HCT 41.8 43.4 41.7 41.0  MCV 83.3 86.1 85.1 85.1  PLT 298 282 315 344   Basic Metabolic Panel: Recent Labs  Lab 11/19/20 1653 11/20/20 0700 11/21/20 0248 11/22/20 0154  NA 131* 133* 132* 134*  K 3.4* 3.6 4.0 4.0  CL 97* 97* 99 99  CO2 23 28 23 25   GLUCOSE 102* 104* 143* 105*  BUN 10 10 15 12   CREATININE 1.07 1.26* 1.13 1.07  CALCIUM 8.4* 8.5* 8.4* 9.0  MG  --  2.1  --   --   PHOS  --  3.2  --   --    GFR: Estimated Creatinine Clearance: 133.3 mL/min (by C-G formula based on SCr of 1.07 mg/dL). Liver Function Tests: Recent Labs  Lab 11/19/20 1653 11/20/20 0700 11/21/20 0248 11/22/20 0154  AST 18 19 18  36  ALT 29 33 31 52*  ALKPHOS 36* 61 49 57  BILITOT 1.1 1.2 0.6 0.5  PROT 7.1 7.3 6.9 7.0  ALBUMIN 3.8 3.1* 2.7* 2.7*   No results for input(s):  LIPASE, AMYLASE in the last 168 hours. No results for input(s): AMMONIA in the last 168 hours. Coagulation Profile: No results for input(s): INR, PROTIME in the last 168 hours. Cardiac Enzymes: No results for input(s): CKTOTAL, CKMB, CKMBINDEX, TROPONINI in the last 168 hours. BNP (last 3 results) No results for input(s): PROBNP in the last 8760 hours. HbA1C: No results for input(s): HGBA1C in the last 72 hours. CBG: Recent Labs  Lab 11/19/20 1743  GLUCAP 114*   Lipid Profile: No results for input(s): CHOL, HDL, LDLCALC, TRIG, CHOLHDL, LDLDIRECT in the last 72 hours. Thyroid Function Tests:  No results for input(s): TSH, T4TOTAL, FREET4, T3FREE, THYROIDAB in the last 72 hours. Anemia Panel: No results for input(s): VITAMINB12, FOLATE, FERRITIN, TIBC, IRON, RETICCTPCT in the last 72 hours. Sepsis Labs: Recent Labs  Lab 11/19/20 1653  LATICACIDVEN 0.6    Recent Results (from the past 240 hour(s))  Resp Panel by RT-PCR (Flu A&B, Covid) Nasopharyngeal Swab     Status: None   Collection Time: 11/19/20  3:20 PM   Specimen: Nasopharyngeal Swab; Nasopharyngeal(NP) swabs in vial transport medium  Result Value Ref Range Status   SARS Coronavirus 2 by RT PCR NEGATIVE NEGATIVE Final    Comment: (NOTE) SARS-CoV-2 target nucleic acids are NOT DETECTED.  The SARS-CoV-2 RNA is generally detectable in upper respiratory specimens during the acute phase of infection. The lowest concentration of SARS-CoV-2 viral copies this assay can detect is 138 copies/mL. A negative result does not preclude SARS-Cov-2 infection and should not be used as the sole basis for treatment or other patient management decisions. A negative result may occur with  improper specimen collection/handling, submission of specimen other than nasopharyngeal swab, presence of viral mutation(s) within the areas targeted by this assay, and inadequate number of viral copies(<138 copies/mL). A negative result must be combined  with clinical observations, patient history, and epidemiological information. The expected result is Negative.  Fact Sheet for Patients:  BloggerCourse.comhttps://www.fda.gov/media/152166/download  Fact Sheet for Healthcare Providers:  SeriousBroker.ithttps://www.fda.gov/media/152162/download  This test is no t yet approved or cleared by the [place] FDA and  has been authorized for detection and/or diagnosis of SARS-CoV-2 by FDA under an Emergency Use Authorization (EUA). This EUA will remain  in effect (meaning this test can be used) for the duration of the COVID-19 declaration under Section 564(b)(1) of the Act, 21 U.S.C.section 360bbb-3(b)(1), unless the authorization is terminated  or revoked sooner.       Influenza A by PCR NEGATIVE NEGATIVE Final   Influenza B by PCR NEGATIVE NEGATIVE Final    Comment: (NOTE) The Xpert Xpress SARS-CoV-2/FLU/RSV plus assay is intended as an aid in the diagnosis of influenza from Nasopharyngeal swab specimens and should not be used as a sole basis for treatment. Nasal washings and aspirates are unacceptable for Xpert Xpress SARS-CoV-2/FLU/RSV testing.  Fact Sheet for Patients: BloggerCourse.comhttps://www.fda.gov/media/152166/download  Fact Sheet for Healthcare Providers: SeriousBroker.ithttps://www.fda.gov/media/152162/download  This test is not yet approved or cleared by the [place] FDA and has been authorized for detection and/or diagnosis of SARS-CoV-2 by FDA under an Emergency Use Authorization (EUA). This EUA will remain in effect (meaning this test can be used) for the duration of the COVID-19 declaration under Section 564(b)(1) of the Act, 21 U.S.C. section 360bbb-3(b)(1), unless the authorization is terminated or revoked.  Performed at [facility], [address], [place], [place] [address]   Blood Culture (routine x 2)     Status: None (Preliminary result)   Collection Time: 11/19/20  4:55 PM   Specimen: BLOOD  Result Value Ref Range Status   Specimen  Description   Final    BLOOD Performed at [facility], [address], [place], [place] [address]    Special Requests   Final    BOTTLES DRAWN AEROBIC AND ANAEROBIC Blood Culture adequate volume BLOOD LEFT HAND Performed at [facility], [address], [place], [place] [address]    Culture   Final    NO GROWTH 2 DAYS Performed at [facility], [address] [place], [place] [address]    Report Status  PENDING  Incomplete  Blood Culture (routine x 2)     Status: None (Preliminary result)   Collection Time: 11/19/20  5:03 PM   Specimen: BLOOD  Result Value Ref Range Status   Specimen Description   Final    BLOOD Performed at [facility], [address], [place], [place] [address]    Special Requests   Final    BOTTLES DRAWN AEROBIC AND ANAEROBIC Blood Culture adequate volume RIGHT ANTECUBITAL Performed at [facility], [address] [place], [place] [address]    Culture   Final    NO GROWTH 2 DAYS Performed at [facility], [address] [place], [place] [address]    Report Status PENDING  Incomplete  Urine culture     Status: None   Collection Time: 11/19/20  5:15 PM   Specimen: In/Out Cath Urine  Result Value Ref Range Status   Specimen Description   Final    IN/OUT CATH URINE Performed at [facility], [address], [place], [place] [address]    Special Requests   Final    NONE Performed at [facility], [address], [place], [place] [address]    Culture   Final    NO GROWTH Performed at [facility], [address], [place], [place] [address]    Report Status 11/21/2020 FINAL  Final  Anaerobic culture w Gram Stain     Status: None (Preliminary result)   Collection Time: 11/20/20  9:24 AM   Specimen: PATH Cytology CSF; Cerebrospinal Fluid  Result Value Ref Range Status   Specimen Description CSF  Final   Special  Requests   Final    NONE Performed at [facility], [address], [place], [place] [address]    Culture   Final    NO ANAEROBES ISOLATED; CULTURE IN PROGRESS FOR 5 DAYS   Report Status PENDING  Incomplete  CSF culture w Gram Stain     Status: None (Preliminary result)   Collection Time: 11/20/20  9:24 AM   Specimen: CSF  Result Value Ref Range Status   Specimen Description CSF  Final   Special Requests NONE  Final   Gram Stain CYTOSPIN SMEAR NO WBC SEEN NO ORGANISMS SEEN   Final   Culture   Final    NO GROWTH < 24 HOURS Performed at [facility], [address], [place], [place] [address]    Report Status PENDING  Incomplete     Radiology Studies: No results found.  Scheduled Meds: . bacitracin-polymyxin b   Both Eyes QHS  . escitalopram  10 mg Oral Daily   Continuous Infusions: . cefTRIAXone (ROCEPHIN)  IV 2 g (11/22/20 1023)     LOS: 1 day   Rickey Barbara, MD [facility] Pager On Amion  If 7PM-7AM, please contact night-coverage 11/22/2020, 3:34 PM

## 2020-11-23 DIAGNOSIS — R509 Fever, unspecified: Secondary | ICD-10-CM

## 2020-11-23 DIAGNOSIS — R519 Headache, unspecified: Secondary | ICD-10-CM | POA: Diagnosis not present

## 2020-11-23 LAB — CBC
HCT: 41.4 % (ref 39.0–52.0)
Hemoglobin: 13.4 g/dL (ref 13.0–17.0)
MCH: 27.4 pg (ref 26.0–34.0)
MCHC: 32.4 g/dL (ref 30.0–36.0)
MCV: 84.7 fL (ref 80.0–100.0)
Platelets: 376 10*3/uL (ref 150–400)
RBC: 4.89 MIL/uL (ref 4.22–5.81)
RDW: 13.3 % (ref 11.5–15.5)
WBC: 16.9 10*3/uL — ABNORMAL HIGH (ref 4.0–10.5)
nRBC: 0 % (ref 0.0–0.2)

## 2020-11-23 LAB — COMPREHENSIVE METABOLIC PANEL
ALT: 66 U/L — ABNORMAL HIGH (ref 0–44)
AST: 36 U/L (ref 15–41)
Albumin: 2.6 g/dL — ABNORMAL LOW (ref 3.5–5.0)
Alkaline Phosphatase: 55 U/L (ref 38–126)
Anion gap: 9 (ref 5–15)
BUN: 14 mg/dL (ref 6–20)
CO2: 27 mmol/L (ref 22–32)
Calcium: 8.6 mg/dL — ABNORMAL LOW (ref 8.9–10.3)
Chloride: 98 mmol/L (ref 98–111)
Creatinine, Ser: 0.98 mg/dL (ref 0.61–1.24)
GFR, Estimated: >60 mL/min (ref >60)
Glucose, Bld: 139 mg/dL — ABNORMAL HIGH (ref 70–99)
Potassium: 3.3 mmol/L — ABNORMAL LOW (ref 3.5–5.1)
Sodium: 134 mmol/L — ABNORMAL LOW (ref 135–145)
Total Bilirubin: 0.3 mg/dL (ref 0.3–1.2)
Total Protein: 6.6 g/dL (ref 6.5–8.1)

## 2020-11-23 LAB — CSF CULTURE W GRAM STAIN: Culture: NO GROWTH

## 2020-11-23 LAB — RAPID HIV SCREEN (HIV 1/2 AB+AG)
HIV 1/2 Antibodies: NONREACTIVE
HIV-1 P24 Antigen - HIV24: NONREACTIVE

## 2020-11-23 MED ORDER — AMOXICILLIN-POT CLAVULANATE 875-125 MG PO TABS: 1.0000 | ORAL_TABLET | Freq: Two times a day (BID) | ORAL | 0 refills | Status: AC

## 2020-11-23 NOTE — Discharge Summary (Signed)
Physician Discharge Summary  Dean Cooper ZOX:[id number] DOB: Sep 10, 1985 DOA: 11/19/2020  PCP:  Trena Plattaniel Koch, MD  [facility] [address] [place], [place] [address] Phone: [phone] Fax: [phone]  Admit date: 11/19/2020 Discharge date: 11/23/2020  Admitted From: Home Disposition:  Home  Recommendations for Outpatient Follow-up:  1. Follow up with PCP in 1-2 weeks  Discharge Condition:Improved CODE STATUS:Full Diet recommendation: Regular   Brief/Interim Summary: 35 y.o.malewith medical history significant forobesity, chronic anxiety/depression, asthma, [facility], fever,and neck stiffness. Associated with congestion,cough,body aches,diarrhea. Deniesphotophobia orany recent head trauma. He lives with his boyfriend who does not have thesesymptoms. He presented to the ED for further evaluation. While in the ED, patient was found to have a feverwith T-max103.2,he wastachycardic, with leukocytosis of 19,000. COVID-19 screening test was negative. UA negative for pyuria. Chest x-ray nonacute. EDP hadconcern forbacterialmeningitis, therefore attempted lumbar puncture but was unsuccessful. Was started on IV antibiotics empirically and placed on droplet isolation by EDP. EDP discussed case with interventional radiology Dr. Manson PasseyBrown who is willing to perform a lumbar puncture [facility]. [facility], hospitalist team,was asked to admit.  Discharge Diagnoses:  Active Problems:   Fever  Headache and neck stiffness, meningitis ruled out -CT w/o contrast reviewed, unremarkable -LP performed by IR reviewed findings, thus far unremarkable -Cultures thus far pan-negative -Had been continued on empiric abx, vanc and rocephin -symptoms are improving -fevers resolved -Had discussed case with ID. -Given pan-negative cultures, narrowed abx to complete empiric augmentin on  d/c -HIV neg  Sepsis, unclear etiology, meningitis ruled out -Presented with leukocytosis, tachycardia, suspected meningitis Was initially continued on broad-spectrum IV antibiotics, IV vancomycin and Rocephin. -Cultures neg. Complete course of empiric augmetnin on d/c -WBC stable  Electrolytes derangement -Replaced  Hypovolemic hyponatremia -Gentle IV fluid hydration with improvement in Na  Chronic anxiety/depression -Continued on home regimen  Discharge Instructions   Allergies as of 11/23/2020   No Known Allergies     Medication List    TAKE these medications   amoxicillin-clavulanate 875-125 MG tablet Commonly known as: Augmentin Take 1 tablet by mouth 2 (two) times daily for 3 days.   escitalopram 10 MG tablet Commonly known as: LEXAPRO Take 1 tablet by mouth daily.   ibuprofen 200 MG tablet Commonly known as: ADVIL Take 400 mg by mouth every 6 (six) hours as needed for moderate pain.   LORazepam 1 MG tablet Commonly known as: Ativan Take 1 tablet (1 mg total) by mouth 3 (three) times daily as needed for anxiety.       Follow-up Information    Follow up with your PCP in 1-2 weeks Follow up.              No Known Allergies  Consultations:  Discussed with ID  Procedures/Studies: DG Chest 2 View  Result Date: 10/30/2020 CLINICAL DATA:  Shortness of breath EXAM: CHEST - 2 VIEW COMPARISON:  None. FINDINGS: The heart size and mediastinal contours are within normal limits. Both lungs are clear. The visualized skeletal structures are unremarkable. IMPRESSION: No active cardiopulmonary disease. Electronically Signed   By: Jasmine PangKim  Fujinaga M.D.   On: 10/30/2020 20:01   CT HEAD WO CONTRAST  Result Date: 11/20/2020 CLINICAL DATA:  Altered mental status cause. EXAM: CT HEAD WITHOUT CONTRAST TECHNIQUE: Contiguous axial images were obtained from the base of the skull through the vertex without intravenous contrast. COMPARISON:  None. FINDINGS: Brain: No  evidence of acute  infarction, hemorrhage, hydrocephalus, extra-axial collection or mass lesion/mass effect. A partial empty sella is noted, unusual patient's age. Vascular: No hyperdense vessel or unexpected calcification. Skull: Normal. Negative for fracture or focal lesion. Sinuses/Orbits: No acute finding. IMPRESSION: 1. No acute intracranial abnormality. 2. Partial empty sella. Electronically Signed   By: Baldemar Lenis M.D.   On: 11/20/2020 08:50   DG Chest Portable 1 View  Result Date: 11/19/2020 CLINICAL DATA:  Fever since Monday, congestion, chills, body aches, runny nose, diarrhea, abdominal pain, neck stiffness, T-max 103 degrees EXAM: PORTABLE CHEST 1 VIEW COMPARISON:  Portable exam 1600 hours compared to 10/30/2020 FINDINGS: Normal heart size, mediastinal contours, and pulmonary vascularity. Lungs clear. No pulmonary infiltrate, pleural effusion, or pneumothorax. Osseous structures unremarkable. IMPRESSION: No acute abnormalities. Electronically Signed   By: Ulyses Southward M.D.   On: 11/19/2020 16:10   DG FL GUIDED LUMBAR PUNCTURE  Result Date: 11/20/2020 CLINICAL DATA:  Fever, possible meningitis EXAM: DIAGNOSTIC LUMBAR PUNCTURE UNDER FLUOROSCOPIC GUIDANCE COMPARISON:  None FLUOROSCOPY TIME:  Fluoroscopy Time:  18 seconds Radiation Exposure Index (if provided by the fluoroscopic device): 5 mGy PROCEDURE: Informed consent was obtained from the patient prior to the procedure, including potential complications of headache, allergy, and pain. With the patient prone, the lower back was prepped with Betadine. 1% Lidocaine was used for local anesthesia. Lumbar puncture was performed at the L3-L4 level using a 20 gauge needle with return of clear CSF. 9 mL of CSF were obtained for laboratory studies. The patient tolerated the procedure well and there were no apparent complications. IMPRESSION: Technically successful fluoroscopic guided lumbar puncture. Electronically Signed   By: Guadlupe Spanish M.D.   On: 11/20/2020 09:53    Subjective: Reports feeling better  Discharge Exam: Vitals:   11/23/20 0509 11/23/20 1130  BP: (!) 154/92 (!) 146/83  Pulse: 86 83  Resp:  14  Temp: 99.4 F (37.4 C) 97.9 F (36.6 C)  SpO2: 96% 97%   Vitals:   11/22/20 1333 11/22/20 2113 11/23/20 0509 11/23/20 1130  BP: (!) 141/81 (!) 149/78 (!) 154/92 (!) 146/83  Pulse: 67 94 86 83  Resp: 20   14  Temp: 98 F (36.7 C) 98.4 F (36.9 C) 99.4 F (37.4 C) 97.9 F (36.6 C)  TempSrc: Oral Oral Oral   SpO2: 96% 95% 96% 97%  Weight:      Height:        General: Pt is alert, awake, not in acute distress Cardiovascular: RRR, S1/S2 + Respiratory: CTA bilaterally, no wheezing, no rhonchi Abdominal: Soft, NT, ND, bowel sounds + Extremities: no edema, no cyanosis   The results of significant diagnostics from this hospitalization (including imaging, microbiology, ancillary and laboratory) are listed below for reference.     Microbiology: Recent Results (from the past 240 hour(s))  Resp Panel by RT-PCR (Flu A&B, Covid) Nasopharyngeal Swab     Status: None   Collection Time: 11/19/20  3:20 PM   Specimen: Nasopharyngeal Swab; Nasopharyngeal(NP) swabs in vial transport medium  Result Value Ref Range Status   SARS Coronavirus 2 by RT PCR NEGATIVE NEGATIVE Final    Comment: (NOTE) SARS-CoV-2 target nucleic acids are NOT DETECTED.  The SARS-CoV-2 RNA is generally detectable in upper respiratory specimens during the acute phase of infection. The lowest concentration of SARS-CoV-2 viral copies this assay can detect is 138 copies/mL. A negative result does not preclude SARS-Cov-2 infection and should not be used as the sole basis for treatment or other patient management  decisions. A negative result may occur with  improper specimen collection/handling, submission of specimen other than nasopharyngeal swab, presence of viral mutation(s) within the areas targeted by this assay, and inadequate  number of viral copies(<138 copies/mL). A negative result must be combined with clinical observations, patient history, and epidemiological information. The expected result is Negative.  Fact Sheet for Patients:  BloggerCourse.com  Fact Sheet for Healthcare Providers:  SeriousBroker.it  This test is no t yet approved or cleared by the [place] FDA and  has been authorized for detection and/or diagnosis of SARS-CoV-2 by FDA under an Emergency Use Authorization (EUA). This EUA will remain  in effect (meaning this test can be used) for the duration of the COVID-19 declaration under Section 564(b)(1) of the Act, 21 U.S.C.section 360bbb-3(b)(1), unless the authorization is terminated  or revoked sooner.       Influenza A by PCR NEGATIVE NEGATIVE Final   Influenza B by PCR NEGATIVE NEGATIVE Final    Comment: (NOTE) The Xpert Xpress SARS-CoV-2/FLU/RSV plus assay is intended as an aid in the diagnosis of influenza from Nasopharyngeal swab specimens and should not be used as a sole basis for treatment. Nasal washings and aspirates are unacceptable for Xpert Xpress SARS-CoV-2/FLU/RSV testing.  Fact Sheet for Patients: BloggerCourse.com  Fact Sheet for Healthcare Providers: SeriousBroker.it  This test is not yet approved or cleared by the [place] FDA and has been authorized for detection and/or diagnosis of SARS-CoV-2 by FDA under an Emergency Use Authorization (EUA). This EUA will remain in effect (meaning this test can be used) for the duration of the COVID-19 declaration under Section 564(b)(1) of the Act, 21 U.S.C. section 360bbb-3(b)(1), unless the authorization is terminated or revoked.  Performed at [facility], [address] [place], [place] [address]   Blood Culture (routine x 2)     Status: None (Preliminary result)   Collection Time:  11/19/20  4:55 PM   Specimen: BLOOD  Result Value Ref Range Status   Specimen Description   Final    BLOOD Performed at [facility], [address] [place], [place] [address]    Special Requests   Final    BOTTLES DRAWN AEROBIC AND ANAEROBIC Blood Culture adequate volume BLOOD LEFT HAND Performed at [facility], [address] [place], [place] [address]    Culture   Final    NO GROWTH 3 DAYS Performed at [facility], [address] [place], [place] [address]    Report Status PENDING  Incomplete  Blood Culture (routine x 2)     Status: None (Preliminary result)   Collection Time: 11/19/20  5:03 PM   Specimen: BLOOD  Result Value Ref Range Status   Specimen Description   Final    BLOOD Performed at [facility], [address], [place], [place] [address]    Special Requests   Final    BOTTLES DRAWN AEROBIC AND ANAEROBIC Blood Culture adequate volume RIGHT ANTECUBITAL Performed at [facility], [address] [place], [place] [address]    Culture   Final    NO GROWTH 3 DAYS Performed at [facility], [address], [place], [place] [address]    Report Status PENDING  Incomplete  Urine culture     Status: None   Collection Time: 11/19/20  5:15 PM   Specimen: In/Out Cath Urine  Result Value Ref Range Status   Specimen Description   Final    IN/OUT CATH URINE Performed  at [facility], [address], [place], [place] [address]    Special Requests   Final    NONE Performed at [facility], [address] [place], [place] [address]    Culture   Final    NO GROWTH Performed at [facility], [address], [place], [place] [address]    Report Status 11/21/2020 FINAL  Final  Anaerobic culture w Gram Stain     Status: None (Preliminary result)   Collection Time: 11/20/20  9:24 AM   Specimen: PATH Cytology CSF; Cerebrospinal Fluid   Result Value Ref Range Status   Specimen Description CSF  Final   Special Requests   Final    NONE Performed at [facility], [address], [place], [place] [address]    Culture   Final    NO ANAEROBES ISOLATED; CULTURE IN PROGRESS FOR 5 DAYS   Report Status PENDING  Incomplete  CSF culture w Gram Stain     Status: None (Preliminary result)   Collection Time: 11/20/20  9:24 AM   Specimen: CSF  Result Value Ref Range Status   Specimen Description CSF  Final   Special Requests NONE  Final   Gram Stain CYTOSPIN SMEAR NO WBC SEEN NO ORGANISMS SEEN   Final   Culture   Final    NO GROWTH < 24 HOURS Performed at [facility], [address] [place], [place] [address]    Report Status PENDING  Incomplete     Labs: BNP (last 3 results) No results for input(s): BNP in the last 8760 hours. Basic Metabolic Panel: Recent Labs  Lab 11/19/20 1653 11/20/20 0700 11/21/20 0248 11/22/20 0154 11/23/20 0158  NA 131* 133* 132* 134* 134*  K 3.4* 3.6 4.0 4.0 3.3*  CL 97* 97* 99 99 98  CO2 GLUCOSE 102* 104* 143* 105* 139*  BUN CREATININE 1.07 1.26* 1.13 1.07 0.98  CALCIUM 8.4* 8.5* 8.4* 9.0 8.6*  MG  --  2.1  --   --   --   PHOS  --  3.2  --   --   --    Liver Function Tests: Recent Labs  Lab 11/19/20 1653 11/20/20 0700 11/21/20 0248 11/22/20 0154 11/23/20 0158  AST 36 36  ALT 29 33 31 52* 66*  ALKPHOS 36* 61 49 57 55  BILITOT 1.1 1.2 0.6 0.5 0.3  PROT 7.1 7.3 6.9 7.0 6.6  ALBUMIN 3.8 3.1* 2.7* 2.7* 2.6*   No results for input(s): LIPASE, AMYLASE in the last 168 hours. No results for input(s): AMMONIA in the last 168 hours. CBC: Recent Labs  Lab 11/19/20 1613 11/20/20 0700 11/21/20 0248 11/22/20 0154 11/23/20 0158  WBC 19.2* 16.4* 17.2* 18.1* 16.9*  NEUTROABS 16.4*  --   --   --   --   HGB 14.1 14.0 13.7 13.5 13.4  HCT 41.8 43.4 41.7 41.0 41.4  MCV 83.3 86.1 85.1 85.1 84.7  PLT 298 282 315 344 376    Cardiac Enzymes: No results for input(s): CKTOTAL, CKMB, CKMBINDEX, TROPONINI in the last 168 hours. BNP: Invalid input(s): POCBNP CBG: Recent Labs  Lab 11/19/20 1743  GLUCAP 114*   D-Dimer No results for input(s): DDIMER in the last 72 hours. Hgb A1c No results for input(s): HGBA1C in the last 72 hours. Lipid Profile No results for input(s): CHOL, HDL, LDLCALC, TRIG, CHOLHDL, LDLDIRECT in  the last 72 hours. Thyroid function studies No results for input(s): TSH, T4TOTAL, T3FREE, THYROIDAB in the last 72 hours.  Invalid input(s): FREET3 Anemia work up No results for input(s): VITAMINB12, FOLATE, FERRITIN, TIBC, IRON, RETICCTPCT in the last 72 hours. Urinalysis    Component Value Date/Time   COLORURINE YELLOW 11/19/2020 1715   APPEARANCEUR CLEAR 11/19/2020 1715   LABSPEC 1.023 11/19/2020 1715   PHURINE 6.5 11/19/2020 1715   GLUCOSEU NEGATIVE 11/19/2020 1715   HGBUR NEGATIVE 11/19/2020 1715   BILIRUBINUR NEGATIVE 11/19/2020 1715   KETONESUR 15 (A) 11/19/2020 1715   PROTEINUR 30 (A) 11/19/2020 1715   NITRITE NEGATIVE 11/19/2020 1715   LEUKOCYTESUR NEGATIVE 11/19/2020 1715   Sepsis Labs Invalid input(s): PROCALCITONIN,  WBC,  LACTICIDVEN Microbiology Recent Results (from the past 240 hour(s))  Resp Panel by RT-PCR (Flu A&B, Covid) Nasopharyngeal Swab     Status: None   Collection Time: 11/19/20  3:20 PM   Specimen: Nasopharyngeal Swab; Nasopharyngeal(NP) swabs in vial transport medium  Result Value Ref Range Status   SARS Coronavirus 2 by RT PCR NEGATIVE NEGATIVE Final    Comment: (NOTE) SARS-CoV-2 target nucleic acids are NOT DETECTED.  The SARS-CoV-2 RNA is generally detectable in upper respiratory specimens during the acute phase of infection. The lowest concentration of SARS-CoV-2 viral copies this assay can detect is 138 copies/mL. A negative result does not preclude SARS-Cov-2 infection and should not be used as the sole basis for treatment or other patient  management decisions. A negative result may occur with  improper specimen collection/handling, submission of specimen other than nasopharyngeal swab, presence of viral mutation(s) within the areas targeted by this assay, and inadequate number of viral copies(<138 copies/mL). A negative result must be combined with clinical observations, patient history, and epidemiological information. The expected result is Negative.  Fact Sheet for Patients:  BloggerCourse.com  Fact Sheet for Healthcare Providers:  SeriousBroker.it  This test is no t yet approved or cleared by the [place] FDA and  has been authorized for detection and/or diagnosis of SARS-CoV-2 by FDA under an Emergency Use Authorization (EUA). This EUA will remain  in effect (meaning this test can be used) for the duration of the COVID-19 declaration under Section 564(b)(1) of the Act, 21 U.S.C.section 360bbb-3(b)(1), unless the authorization is terminated  or revoked sooner.       Influenza A by PCR NEGATIVE NEGATIVE Final   Influenza B by PCR NEGATIVE NEGATIVE Final    Comment: (NOTE) The Xpert Xpress SARS-CoV-2/FLU/RSV plus assay is intended as an aid in the diagnosis of influenza from Nasopharyngeal swab specimens and should not be used as a sole basis for treatment. Nasal washings and aspirates are unacceptable for Xpert Xpress SARS-CoV-2/FLU/RSV testing.  Fact Sheet for Patients: BloggerCourse.com  Fact Sheet for Healthcare Providers: SeriousBroker.it  This test is not yet approved or cleared by the [place] FDA and has been authorized for detection and/or diagnosis of SARS-CoV-2 by FDA under an Emergency Use Authorization (EUA). This EUA will remain in effect (meaning this test can be used) for the duration of the COVID-19 declaration under Section 564(b)(1) of the Act, 21 U.S.C. section 360bbb-3(b)(1),  unless the authorization is terminated or revoked.  Performed at [facility], [address] [place], [place] [address]   Blood Culture (routine x 2)     Status: None (Preliminary result)   Collection Time: 11/19/20  4:55 PM   Specimen: BLOOD  Result Value Ref Range Status   Specimen Description  Final    BLOOD Performed at [facility], [address] [place], [place] [address]    Special Requests   Final    BOTTLES DRAWN AEROBIC AND ANAEROBIC Blood Culture adequate volume BLOOD LEFT HAND Performed at [facility], [address], [place], [place] [address]    Culture   Final    NO GROWTH 3 DAYS Performed at [facility], [address] [place], [place] [address]    Report Status PENDING  Incomplete  Blood Culture (routine x 2)     Status: None (Preliminary result)   Collection Time: 11/19/20  5:03 PM   Specimen: BLOOD  Result Value Ref Range Status   Specimen Description   Final    BLOOD Performed at [facility], [address] [place], [place] [address]    Special Requests   Final    BOTTLES DRAWN AEROBIC AND ANAEROBIC Blood Culture adequate volume RIGHT ANTECUBITAL Performed at [facility], [address] [place], [place] [address]    Culture   Final    NO GROWTH 3 DAYS Performed at [facility], [address], [place], [place] [address]    Report Status PENDING  Incomplete  Urine culture     Status: None   Collection Time: 11/19/20  5:15 PM   Specimen: In/Out Cath Urine  Result Value Ref Range Status   Specimen Description   Final    IN/OUT CATH URINE Performed at [facility], [address] [place], [place] [address]    Special Requests   Final    NONE Performed at [facility], [address], [place], [place] [address]    Culture   Final    NO GROWTH Performed at [facility], [address] [place], [place] [address]    Report Status 11/21/2020 FINAL  Final  Anaerobic culture w Gram Stain     Status: None (Preliminary result)   Collection Time: 11/20/20  9:24 AM   Specimen: PATH Cytology CSF; Cerebrospinal Fluid  Result Value Ref Range Status   Specimen Description CSF  Final   Special Requests   Final    NONE Performed at [facility], [address], [place], [place] [address]    Culture   Final    NO ANAEROBES ISOLATED; CULTURE IN PROGRESS FOR 5 DAYS   Report Status PENDING  Incomplete  CSF culture w Gram Stain     Status: None (Preliminary result)   Collection Time: 11/20/20  9:24 AM   Specimen: CSF  Result Value Ref Range Status   Specimen Description CSF  Final   Special Requests NONE  Final   Gram Stain CYTOSPIN SMEAR NO WBC SEEN NO ORGANISMS SEEN   Final   Culture   Final    NO GROWTH < 24 HOURS Performed at [facility], [address], [place], [place] [address]    Report Status PENDING  Incomplete   Time spent: 30 min  SIGNED:   Rickey Barbara, MD  [facility] 11/23/2020, 2:09 PM  If 7PM-7AM, please contact night-coverage

## 2020-11-24 LAB — CULTURE, BLOOD (ROUTINE X 2)
Culture: NO GROWTH
Culture: NO GROWTH

## 2020-11-25 LAB — ANAEROBIC CULTURE W GRAM STAIN

## 2021-02-27 ENCOUNTER — Ambulatory Visit (INDEPENDENT_AMBULATORY_CARE_PROVIDER_SITE_OTHER): Payer: 59

## 2021-02-27 ENCOUNTER — Other Ambulatory Visit: Payer: Self-pay

## 2021-02-27 DIAGNOSIS — Z23 Encounter for immunization: Secondary | ICD-10-CM

## 2021-02-27 NOTE — Progress Notes (Signed)
    [facility]  Name:  Dean Cooper    MRN: [id number] DOB: Jan 07, 1986   02/27/2021  Dean Cooper was observed post JYNNEOS immunization for 15 minutes without incident. He was provided with Vaccine Information Sheet and instruction to access the V-Safe system.   Dean Cooper was instructed to call 911 with any severe reactions post vaccine: Difficulty breathing  Swelling of face and throat  A fast heartbeat  A bad rash all over body  Dizziness and weakness

## 2021-04-10 ENCOUNTER — Ambulatory Visit: Payer: 59

## 2021-07-08 ENCOUNTER — Telehealth (HOSPITAL_COMMUNITY): Payer: Self-pay | Admitting: Psychiatry

## 2021-07-08 ENCOUNTER — Ambulatory Visit (HOSPITAL_COMMUNITY)
Admission: RE | Admit: 2021-07-08 | Discharge: 2021-07-08 | Disposition: A | Discharge status: Home / Self Care | Payer: 59 | Attending: Psychiatry | Admitting: Psychiatry

## 2021-07-08 DIAGNOSIS — R45851 Suicidal ideations: Secondary | ICD-10-CM | POA: Insufficient documentation

## 2021-07-08 DIAGNOSIS — F411 Generalized anxiety disorder: Secondary | ICD-10-CM | POA: Insufficient documentation

## 2021-07-08 DIAGNOSIS — Z9151 Personal history of suicidal behavior: Secondary | ICD-10-CM | POA: Diagnosis not present

## 2021-07-08 DIAGNOSIS — F4323 Adjustment disorder with mixed anxiety and depressed mood: Secondary | ICD-10-CM | POA: Diagnosis not present

## 2021-07-08 DIAGNOSIS — Z630 Problems in relationship with spouse or partner: Secondary | ICD-10-CM | POA: Diagnosis not present
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
## 2021-07-08 NOTE — H&P (Signed)
Behavioral Health Medical Screening Exam  Dean Cooper is a 36 y.o. male who voluntarily presents to the [facility] for evaluation. Pt states he is working with [employer] in the [facility]. Pt states, I had a rough 2 months with my boyfriend and I need to get some help. Added that 3 weeks ago, he OD on 20 Tablets of Ativan 1 mg due to problems with boyfriend. Pt became disoriented, vomited and dizzy and the boyfriend called EMS for help. Pt states he had counseling after the episode and the relationship was improved. Then he tricked the boyfriend that he was going to put Ketchup in his drinks on his birthday and the boyfriend freaked-out. Patient states the boyfriend asked him to move out due to his psychotic episode. Finally, yesterday he broke-up with the boyfriend and went to stay with his sister.  Pt admits to anxiety and panic attack due to thinking he is going to loose his home which he shares paying bills with the boyfriend. Pt states for the past 2 weeks, he tends to isolate himself, crying, feeling hopeless, guilt, and compromised concentration. Today, Pt denies suicidal ideation or thoughts, irritability, legal issues, or drug use.  Pt contracts for safety after assessment. From my evaluation, Pt needs outpatient resources and, the following outpatient resources were provided: Referral to IOP Programs; increase in therapy sessions from 1/month to weekly sessions until behavior improves; [place]; and a need to see a psychiatrist.   Total Time spent with patient: 20 minutes  Psychiatric Specialty Exam: See PSE Grid notes.  Presentation  General Appearance: Appropriate for Environment; Casual; Neat; Fairly Groomed Eye Contact:Good Speech:Clear and Coherent; Normal Rate Speech Volume:Normal Handedness:Right  Mood and Affect  Mood:Anxious Affect:Appropriate; Congruent  Thought Process  Thought Processes:Linear Descriptions of Associations:Intact  Orientation:Full  (Time, Place and Person)  Thought Content:Logical; WDL  History of Schizophrenia/Schizoaffective disorder:No  Duration of Psychotic Symptoms:No data recorded Hallucinations:Hallucinations: None  Ideas of Reference:None  Suicidal Thoughts:Suicidal Thoughts: Yes, Active (Stated that he ingested 20 tablets of Ativan 1 mg 3 weeks ago due to disagreement with boyfriend.) SI Active Intent and/or Plan: With Intent; With Plan; With Access to Means (Ingested 20 Tablets of ativan 1 mg, 3 weeks ago to kill himself.)  Homicidal Thoughts: No  Sensorium  Memory:Recent Good; Immediate Good; Remote Good Judgment:Fair Insight:Fair  Executive Functions  Concentration:No data recorded Attention Span:Good [place] Language:Good  Psychomotor Activity  Psychomotor Activity:Psychomotor Activity: Normal  Assets  Assets:Communication Skills; Financial Resources/Insurance; Desire for Improvement  Sleep  Sleep:Sleep: Fair (Sleeps 6-7 hours per day.) Number of Hours of Sleep: -1   Physical Exam: Physical Exam Vitals reviewed.  Constitutional:      Appearance: Normal appearance. He is obese.  HENT:     Head: Normocephalic and atraumatic.     Right Ear: External ear normal.     Left Ear: External ear normal.     Ears:     Comments: Both ears large    Nose: Nose normal.  Eyes:     Extraocular Movements: Extraocular movements intact.  Cardiovascular:     Rate and Rhythm: Normal rate.     Pulses: Normal pulses.  Pulmonary:     Effort: Pulmonary effort is normal.     Breath sounds: Normal breath sounds.  Abdominal:     Palpations: Abdomen is soft.  Musculoskeletal:        General: Normal range of motion.     Cervical back: Normal range  of motion and neck supple.  Skin:    General: Skin is warm and dry.  Neurological:     General: No focal deficit present.     Mental Status: He is alert and oriented to person, place, and time. Mental status is at baseline.   Psychiatric:        Behavior: Behavior normal.        Thought Content: Thought content normal.   Review of Systems  Constitutional: Negative.   HENT: Negative.    Eyes: Negative.   Respiratory: Negative.    Cardiovascular: Negative.   Gastrointestinal: Negative.   Genitourinary: Negative.   Musculoskeletal: Negative.   Skin: Negative.   Neurological: Negative.   Endo/Heme/Allergies: Negative.   Psychiatric/Behavioral:  Positive for suicidal ideas. The patient is nervous/anxious.   Blood pressure 129/80, pulse 96, temperature 98.2 F (36.8 C), temperature source Oral, resp. rate 16, SpO2 99 %. There is no height or weight on file to calculate BMI.  Musculoskeletal:  Strength & Muscle Tone: within normal limits Gait & Station: normal Patient leans: N/A  Recommendations:  Based on my evaluation the patient does not appear to have an emergency medical condition.  Laretta Bolster, FNP 07/08/2021, 4:17 PM

## 2021-07-24 ENCOUNTER — Telehealth: Payer: Self-pay

## 2021-07-24 NOTE — Telephone Encounter (Signed)
Attempted to call patient to see if he was interested in getting his second MPX vaccination. No answer/No voicemail box to leave a message.     Renette Hsu Lesli Albee, CMA

## 2021-07-30 IMAGING — DX DG CHEST 1V PORT
1 series · 1 of 1 positions shown · non-contrast
Comparison: Portable exam 2055 hours compared to 10/30/2020

CLINICAL DATA: Fever since [REDACTED], congestion, chills, body aches,
degrees

EXAM:
PORTABLE CHEST 1 VIEW

[chest]
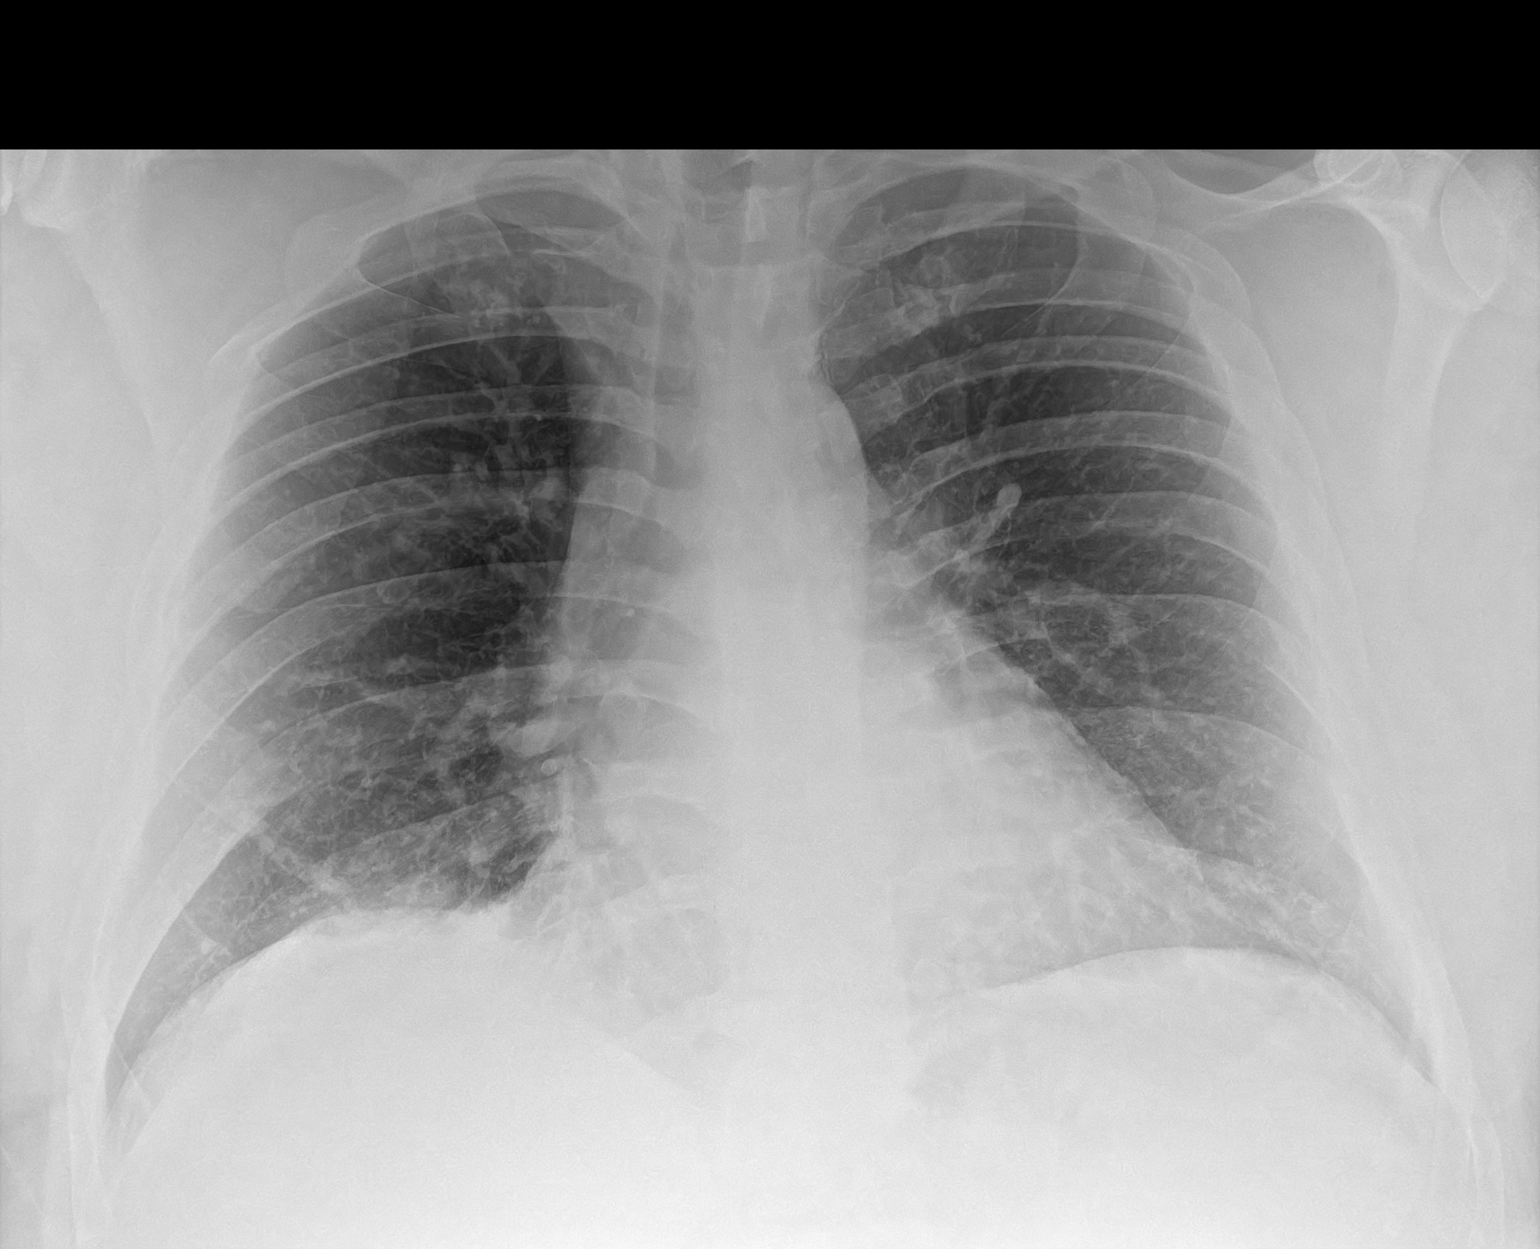

[1 of 1 positions shown; findings below may reference images not displayed]

FINDINGS: Normal heart size, mediastinal contours, and pulmonary vascularity.

Lungs clear.

No pulmonary infiltrate, pleural effusion, or pneumothorax.

Osseous structures unremarkable.
IMPRESSION: No acute abnormalities.

## 2021-07-31 IMAGING — RF DG SPINAL PUNCT LUMBAR DIAG WITH FL CT GUIDANCE
1 series · 1 of 1 positions shown · non-contrast
Comparison: None

CLINICAL DATA: Fever, possible meningitis

EXAM:
DIAGNOSTIC LUMBAR PUNCTURE UNDER FLUOROSCOPIC GUIDANCE

[Series 1: cp_standard · 0.17mm/px · 1 of 1 slices shown]
[im 1/1]
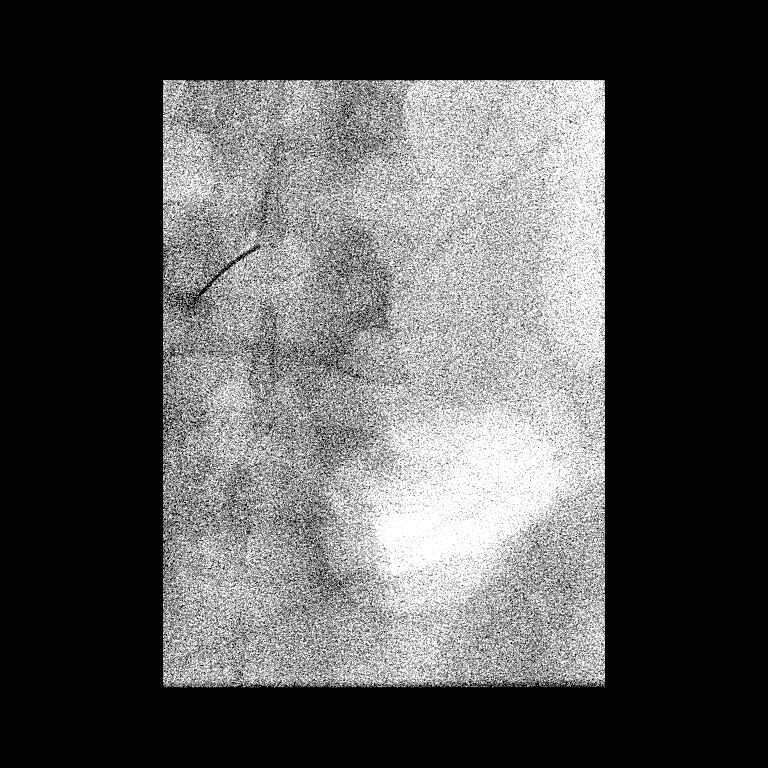

[1 of 1 positions shown; findings below may reference images not displayed]

FLUOROSCOPY TIME:  Fluoroscopy Time:  18 seconds

Radiation Exposure Index (if provided by the fluoroscopic device): 5
mGy

PROCEDURE:
Informed consent was obtained from the patient prior to the
procedure, including potential complications of headache, allergy,
and pain. With the patient prone, the lower back was prepped with
Betadine. 1% Lidocaine was used for local anesthesia. Lumbar
puncture was performed at the L3-L4 level using a 20 gauge needle
with return of clear CSF. 9 mL of CSF were obtained for laboratory
studies. The patient tolerated the procedure well and there were no
apparent complications.
IMPRESSION: Technically successful fluoroscopic guided lumbar puncture.

## 2021-08-07 NOTE — Addendum Note (Signed)
Encounter addended by: Cecilie Lowers, FNP on: 08/07/2021 6:27 PM  Actions taken: Clinical Note Signed

## 2021-12-18 ENCOUNTER — Other Ambulatory Visit: Payer: Self-pay

## 2021-12-18 ENCOUNTER — Emergency Department (HOSPITAL_BASED_OUTPATIENT_CLINIC_OR_DEPARTMENT_OTHER): Payer: 59

## 2021-12-18 ENCOUNTER — Emergency Department (HOSPITAL_BASED_OUTPATIENT_CLINIC_OR_DEPARTMENT_OTHER)
Admission: EM | Admit: 2021-12-18 | Discharge: 2021-12-18 | Disposition: A | Discharge status: Home / Self Care | Payer: 59 | Attending: Emergency Medicine | Admitting: Emergency Medicine

## 2021-12-18 ENCOUNTER — Encounter (HOSPITAL_BASED_OUTPATIENT_CLINIC_OR_DEPARTMENT_OTHER): Payer: Self-pay

## 2021-12-18 DIAGNOSIS — I10 Essential (primary) hypertension: Secondary | ICD-10-CM | POA: Insufficient documentation

## 2021-12-18 DIAGNOSIS — D72829 Elevated white blood cell count, unspecified: Secondary | ICD-10-CM | POA: Insufficient documentation

## 2021-12-18 DIAGNOSIS — R1031 Right lower quadrant pain: Secondary | ICD-10-CM | POA: Diagnosis present

## 2021-12-18 HISTORY — DX: Essential (primary) hypertension: I10

## 2021-12-18 LAB — LIPASE, BLOOD: Lipase: 13 U/L (ref 11–51)

## 2021-12-18 LAB — COMPREHENSIVE METABOLIC PANEL
ALT: 22 U/L (ref 0–44)
AST: 17 U/L (ref 15–41)
Albumin: 4.3 g/dL (ref 3.5–5.0)
Alkaline Phosphatase: 27 U/L — ABNORMAL LOW (ref 38–126)
Anion gap: 12 (ref 5–15)
BUN: 16 mg/dL (ref 6–20)
CO2: 25 mmol/L (ref 22–32)
Calcium: 10 mg/dL (ref 8.9–10.3)
Chloride: 97 mmol/L — ABNORMAL LOW (ref 98–111)
Creatinine, Ser: 1.07 mg/dL (ref 0.61–1.24)
GFR, Estimated: >60 mL/min (ref >60)
Glucose, Bld: 93 mg/dL (ref 70–99)
Potassium: 3.7 mmol/L (ref 3.5–5.1)
Sodium: 134 mmol/L — ABNORMAL LOW (ref 135–145)
Total Bilirubin: 0.4 mg/dL (ref 0.3–1.2)
Total Protein: 8.3 g/dL — ABNORMAL HIGH (ref 6.5–8.1)

## 2021-12-18 LAB — URINALYSIS, ROUTINE W REFLEX MICROSCOPIC
Bilirubin Urine: NEGATIVE
Glucose, UA: NEGATIVE mg/dL
Hgb urine dipstick: NEGATIVE
Ketones, ur: NEGATIVE mg/dL
Leukocytes,Ua: NEGATIVE
Nitrite: NEGATIVE
Specific Gravity, Urine: 1.03 (ref 1.005–1.030)
pH: 6 (ref 5.0–8.0)

## 2021-12-18 LAB — CBC
HCT: 46.9 % (ref 39.0–52.0)
Hemoglobin: 15.3 g/dL (ref 13.0–17.0)
MCH: 28.1 pg (ref 26.0–34.0)
MCHC: 32.6 g/dL (ref 30.0–36.0)
MCV: 86.2 fL (ref 80.0–100.0)
Platelets: 353 10*3/uL (ref 150–400)
RBC: 5.44 MIL/uL (ref 4.22–5.81)
RDW: 13.2 % (ref 11.5–15.5)
WBC: 11.1 10*3/uL — ABNORMAL HIGH (ref 4.0–10.5)
nRBC: 0 % (ref 0.0–0.2)

## 2021-12-18 MED ORDER — IOHEXOL 300 MG/ML  SOLN
100.0000 mL | Freq: Once | INTRAMUSCULAR | Status: AC | PRN
  Administered 2021-12-18: 100 mL via INTRAVENOUS

## 2021-12-18 NOTE — ED Triage Notes (Signed)
Pt presents with acute onset of RLQ pain starting at 2pm today. Reports he had a sudden onset of sharp pain to his Right posterior knee, then to his RLQ, became dizzy and feeling hot, metallic taste to his mouth and sweating. Pt took 2 motrin tablets and 1 tylenol. Pain started to subside. Constant dull pain now. PCP sent him here to r/o appendicitis

## 2021-12-18 NOTE — Discharge Instructions (Signed)
Work-up today was reassuring, the CT scan does not show any indications of appendicitis or acute abnormalities to her abdomen.  Your blood work also is reassuring.  Please follow-up with your primary doctor on Monday as scheduled, return to the ED if you have new or concerning symptoms.  You can take Tylenol and Motrin for the pain.

## 2021-12-18 NOTE — ED Provider Notes (Signed)
[facility] Provider Note   CSN: [id number] Arrival date & time: 12/18/21  1523     History  Chief Complaint  Patient presents with   Abdominal Pain    Dean Cooper is a 36 y.o. male.   Abdominal Pain   Patient with medical history of hypertension, anxiety, panic disorder presents today due to right lower quadrant abdominal pain.  The pain started acutely at 2 PM, has been constant but the severity improved after taking Tylenol and Motrin.  Does not radiate elsewhere, worse with any movement.  No nausea, vomiting, fevers, change in bowel habits, blood in the stool, previous abdominal surgeries, testicle swelling, penile discharge.  Patient states he called his PCP told him to ED for evaluation for appendicitis, he has appointment on Monday for follow-up.  Home Medications Prior to Admission medications   Medication Sig Start Date End Date Taking? Authorizing Provider  escitalopram (LEXAPRO) 10 MG tablet Take 1 tablet by mouth daily. 11/10/20   [provider]  ibuprofen (ADVIL) 200 MG tablet Take 400 mg by mouth every 6 (six) hours as needed for moderate pain.    [provider]  LORazepam (ATIVAN) 1 MG tablet Take 1 tablet (1 mg total) by mouth 3 (three) times daily as needed for anxiety. 10/31/20   Tegeler, Gwenyth Allegra, MD      Allergies    Patient has no known allergies.    Review of Systems   Review of Systems  Gastrointestinal:  Positive for abdominal pain.    Physical Exam Updated Vital Signs BP (!) 143/71   Pulse 80   Temp 98.2 F (36.8 C)   Resp 16   SpO2 97%  Physical Exam Vitals and nursing note reviewed. Exam conducted with a chaperone present.  Constitutional:      Appearance: Normal appearance.  HENT:     Head: Normocephalic and atraumatic.  Eyes:     General: No scleral icterus.       Right eye: No discharge.        Left eye: No discharge.     Extraocular Movements: Extraocular movements intact.      Pupils: Pupils are equal, round, and reactive to light.  Cardiovascular:     Rate and Rhythm: Normal rate and regular rhythm.     Pulses: Normal pulses.     Heart sounds: Normal heart sounds. No murmur heard.    No friction rub. No gallop.  Pulmonary:     Effort: Pulmonary effort is normal. No respiratory distress.     Breath sounds: Normal breath sounds.  Abdominal:     General: Abdomen is flat. Bowel sounds are normal. There is no distension.     Palpations: Abdomen is soft.     Tenderness: There is abdominal tenderness in the right lower quadrant. There is no guarding.     Comments: Tenderness to the right lower quadrant, abdomen is soft.  There is no rebound, rigidity or guarding  Skin:    General: Skin is warm and dry.     Coloration: Skin is not jaundiced.  Neurological:     Mental Status: He is alert. Mental status is at baseline.     Coordination: Coordination normal.     ED Results / Procedures / Treatments   Labs (all labs ordered are listed, but only abnormal results are displayed) Labs Reviewed  COMPREHENSIVE METABOLIC PANEL - Abnormal; Notable for the following components:      Result Value   Sodium  134 (*)    Chloride 97 (*)    Total Protein 8.3 (*)    Alkaline Phosphatase 27 (*)    All other components within normal limits  CBC - Abnormal; Notable for the following components:   WBC 11.1 (*)    All other components within normal limits  URINALYSIS, ROUTINE W REFLEX MICROSCOPIC - Abnormal; Notable for the following components:   Protein, ur TRACE (*)    All other components within normal limits  LIPASE, BLOOD    EKG None  Radiology CT Abdomen Pelvis W Contrast  Result Date: 12/18/2021 CLINICAL DATA:  Right lower quadrant pain EXAM: CT ABDOMEN AND PELVIS WITH CONTRAST TECHNIQUE: Multidetector CT imaging of the abdomen and pelvis was performed using the standard protocol following bolus administration of intravenous contrast. RADIATION DOSE REDUCTION:  This exam was performed according to the departmental dose-optimization program which includes automated exposure control, adjustment of the mA and/or kV according to patient size and/or use of iterative reconstruction technique. CONTRAST:  142m OMNIPAQUE IOHEXOL 300 MG/ML  SOLN COMPARISON:  None Available. FINDINGS: Lower chest: No acute abnormality. Hepatobiliary: Hepatic steatosis. No calcified gallstone or biliary dilatation Pancreas: Unremarkable. No pancreatic ductal dilatation or surrounding inflammatory changes. Spleen: Normal in size without focal abnormality. Adrenals/Urinary Tract: Adrenal glands are unremarkable. Kidneys are normal, without renal calculi, focal lesion, or hydronephrosis. Bladder is unremarkable. Stomach/Bowel: Stomach is within normal limits. Appendix appears normal. No evidence of bowel wall thickening, distention, or inflammatory changes. Vascular/Lymphatic: No significant vascular findings are present. No enlarged abdominal or pelvic lymph nodes. Reproductive: Prostate is unremarkable. Other: No abdominal wall hernia or abnormality. No abdominopelvic ascites. Musculoskeletal: No acute or significant osseous findings. IMPRESSION: 1. No CT evidence for acute intra-abdominal or pelvic abnormality. Negative for appendicitis 2. Hepatic steatosis Electronically Signed   By: KDonavan FoilM.D.   On: 12/18/2021 19:27    Procedures Procedures    Medications Ordered in ED Medications  iohexol (OMNIPAQUE) 300 MG/ML solution 100 mL (100 mLs Intravenous Contrast Given 12/18/21 1912)    ED Course/ Medical Decision Making/ A&P                           Medical Decision Making Amount and/or Complexity of Data Reviewed Labs: ordered. Radiology: ordered.  Risk Prescription drug management.   Patient presents with right lower quadrant abdominal pain.  Differential diagnosis includes but not limited to appendicitis, UTI, pyelonephritis, nephrolithiasis, colitis.  On exam patient  does have focal right lower quadrant tenderness but there are no peritoneal signs.  His vitals are stable though he is mildly hypertensive.  We will proceed with laboratory work-up and CT imaging to evaluate for appendicitis.  Mild leukocytosis of 11, lipase unremarkable.  CMP is without any gross electrolyte derangement or AKI.  Mild low alk phos but no transaminitis.  UA is with some trace protein but no signs of UTI or hematuria.  CT abdomen pelvis with contrast viewed by myself.  I do not see any acute process, agree with radiologist interpretation.  No signs of appendicitis.  Repeat abdominal exam is benign.  Patient's pain is improving throughout the day.  Discussed results with the patient, encouraged him to keep his appointment on Monday to follow-up with his primary and return to the ED if the pain worsens, changes or if he has new concerning symptoms.  Given the CT abdomen pelvis is negative for any acute process at this time I feel he is  stable and appropriate for outpatient follow-up.        Final Clinical Impression(s) / ED Diagnoses Final diagnoses:  RLQ abdominal pain    Rx / DC Orders ED Discharge Orders     None         Sherrill Raring, Hershal Coria 12/18/21 1943    Lucrezia Starch, MD 12/19/21 2118

## 2021-12-18 NOTE — ED Notes (Signed)
Patient ambulated to restroom, no acute distress

## 2021-12-18 NOTE — ED Notes (Signed)
Reviewed AVS/discharge instruction with patient. Time allotted for and all questions answered. Patient is agreeable for d/c and escorted to ed exit by staff.  

## 2023-08-04 ENCOUNTER — Other Ambulatory Visit: Payer: Self-pay

## 2023-08-04 ENCOUNTER — Emergency Department (HOSPITAL_BASED_OUTPATIENT_CLINIC_OR_DEPARTMENT_OTHER): Payer: Self-pay | Admitting: Radiology

## 2023-08-04 ENCOUNTER — Encounter (HOSPITAL_BASED_OUTPATIENT_CLINIC_OR_DEPARTMENT_OTHER): Payer: Self-pay

## 2023-08-04 DIAGNOSIS — R0789 Other chest pain: Secondary | ICD-10-CM | POA: Insufficient documentation

## 2023-08-04 DIAGNOSIS — J101 Influenza due to other identified influenza virus with other respiratory manifestations: Secondary | ICD-10-CM | POA: Insufficient documentation

## 2023-08-04 DIAGNOSIS — I10 Essential (primary) hypertension: Secondary | ICD-10-CM | POA: Insufficient documentation

## 2023-08-04 DIAGNOSIS — E119 Type 2 diabetes mellitus without complications: Secondary | ICD-10-CM | POA: Insufficient documentation

## 2023-08-04 LAB — BASIC METABOLIC PANEL
Anion gap: 10 (ref 5–15)
BUN: 13 mg/dL (ref 6–20)
CO2: 25 mmol/L (ref 22–32)
Calcium: 8.9 mg/dL (ref 8.9–10.3)
Chloride: 96 mmol/L — ABNORMAL LOW (ref 98–111)
Creatinine, Ser: 0.99 mg/dL (ref 0.61–1.24)
GFR, Estimated: >60 mL/min (ref >60)
Glucose, Bld: 167 mg/dL — ABNORMAL HIGH (ref 70–99)
Potassium: 4.3 mmol/L (ref 3.5–5.1)
Sodium: 131 mmol/L — ABNORMAL LOW (ref 135–145)

## 2023-08-04 LAB — CBC
HCT: 46.5 % (ref 39.0–52.0)
Hemoglobin: 15.3 g/dL (ref 13.0–17.0)
MCH: 28.6 pg (ref 26.0–34.0)
MCHC: 32.9 g/dL (ref 30.0–36.0)
MCV: 86.9 fL (ref 80.0–100.0)
Platelets: 195 10*3/uL (ref 150–400)
RBC: 5.35 MIL/uL (ref 4.22–5.81)
RDW: 13.2 % (ref 11.5–15.5)
WBC: 6.1 10*3/uL (ref 4.0–10.5)
nRBC: 0 % (ref 0.0–0.2)

## 2023-08-04 NOTE — ED Triage Notes (Addendum)
Pt reports he is here today due to congestion,fevers, cough, nausea. Pt reports 1 episode of dizziness today. Pt reports it feels like his chest is on fire but only with coughing. Pt reports fevers as high as 101.0. Pt reports he has been taking Nyquil and motrin to keep it under control.

## 2023-08-05 ENCOUNTER — Emergency Department (HOSPITAL_BASED_OUTPATIENT_CLINIC_OR_DEPARTMENT_OTHER): Payer: Self-pay

## 2023-08-05 ENCOUNTER — Emergency Department (HOSPITAL_BASED_OUTPATIENT_CLINIC_OR_DEPARTMENT_OTHER)
Admission: EM | Admit: 2023-08-05 | Discharge: 2023-08-05 | Disposition: A | Discharge status: Home / Self Care | Payer: Self-pay | Attending: Emergency Medicine | Admitting: Emergency Medicine

## 2023-08-05 DIAGNOSIS — J111 Influenza due to unidentified influenza virus with other respiratory manifestations: Secondary | ICD-10-CM

## 2023-08-05 DIAGNOSIS — R0789 Other chest pain: Secondary | ICD-10-CM

## 2023-08-05 LAB — TROPONIN I (HIGH SENSITIVITY)
Troponin I (High Sensitivity): 4 ng/L (ref <18)
Troponin I (High Sensitivity): 4 ng/L (ref <18)

## 2023-08-05 LAB — D-DIMER, QUANTITATIVE: D-Dimer, Quant: 0.51 ug{FEU}/mL — ABNORMAL HIGH (ref 0.00–0.50)

## 2023-08-05 LAB — RESP PANEL BY RT-PCR (RSV, FLU A&B, COVID)  RVPGX2
Influenza A by PCR: POSITIVE — AB
Influenza B by PCR: NEGATIVE
Resp Syncytial Virus by PCR: NEGATIVE
SARS Coronavirus 2 by RT PCR: NEGATIVE

## 2023-08-05 MED ORDER — OSELTAMIVIR PHOSPHATE 75 MG PO CAPS: 75.0000 mg | ORAL_CAPSULE | Freq: Two times a day (BID) | ORAL | 0 refills | Status: AC

## 2023-08-05 MED ORDER — IOHEXOL 350 MG/ML SOLN
100.0000 mL | Freq: Once | INTRAVENOUS | Status: AC | PRN
  Administered 2023-08-05: 80 mL via INTRAVENOUS

## 2023-08-05 MED ORDER — IPRATROPIUM-ALBUTEROL 0.5-2.5 (3) MG/3ML IN SOLN
3.0000 mL | Freq: Once | RESPIRATORY_TRACT | Status: AC
  Administered 2023-08-05: 3 mL via RESPIRATORY_TRACT
  Filled 2023-08-05: qty 3

## 2023-08-05 MED ORDER — ALBUTEROL SULFATE HFA 108 (90 BASE) MCG/ACT IN AERS
1.0000 | INHALATION_SPRAY | Freq: Four times a day (QID) | RESPIRATORY_TRACT | 0 refills | Status: AC | PRN
Start: 2023-08-05 — End: ?

## 2023-08-05 NOTE — ED Notes (Signed)
Pt maintained a saturation of 95%-96% while ambulating

## 2023-08-05 NOTE — Discharge Instructions (Signed)
Your flu test is positive.  No evidence of heart attack or blood clot in the lung.  Take the anti-inflammatories as prescribed for needed for fever and aches.  No evidence of heart attack or blood clot in the lung.  Follow-up with your primary doctor.  Return to the ED with difficulty breathing, worsening shortness of breath, unable to eat or drink, any other concerns

## 2023-08-05 NOTE — ED Provider Notes (Signed)
[facility] EM[facility]GENC[facility] DEPARTMENT AT [facility] Provider Note   CSN: [id number] Arrival date & time: 08/04/23  2307     History  Chief Complaint  Patient presents with   Cough    ROBERTH Cooper is a 38 y.o. male.  Patient with a history of hypertension and diabetes here with a 2-day history of cough, congestion, body aches, chills and fever.  Roommate has been sick with the flu.  Has chest pain with coughing and lots of congestion and feels like he cannot get anything up for clear mucus.  Fevers high as 101.1.  He took NyQuil and Motrin at home prior to coming to the hospital.  Denies any cardiac or pulmonary history.  No history of asthma or COPD.  No history of CAD or stents.  Feels achy all over and having chest pain with coughing.  No vomiting or diarrhea.  Having congestion and sore throat as well.  No pain with urination or blood in the urine.    The history is provided by the patient.  Cough Associated symptoms: chest pain, fever, headaches, myalgias and rhinorrhea   Associated symptoms: no rash        Home Medications Prior to Admission medications   Medication Sig Start Date End Date Taking? Authorizing Provider  escitalopram (LEXAPRO) 10 MG tablet Take 1 tablet by mouth daily. 11/10/20   [provider]  ibuprofen (ADVIL) 200 MG tablet Take 400 mg by mouth every 6 (six) hours as needed for moderate pain.    [provider]  LORazepam (ATIVAN) 1 MG tablet Take 1 tablet (1 mg total) by mouth 3 (three) times daily as needed for anxiety. 10/31/20   Tegeler, Canary Brim, MD      Allergies    Patient has no known allergies.    Review of Systems   Review of Systems  Constitutional:  Positive for activity change, appetite change, fatigue and fever.  HENT:  Positive for congestion and rhinorrhea.   Respiratory:  Positive for cough and chest tightness.   Cardiovascular:  Positive for chest pain.  Gastrointestinal:  Positive for nausea. Negative for  abdominal pain and vomiting.  Genitourinary:  Negative for dysuria and enuresis.  Musculoskeletal:  Positive for arthralgias and myalgias.  Skin:  Negative for rash.  Neurological:  Positive for headaches. Negative for weakness.   all other systems are negative except as noted in the HPI and PMH.    Physical Exam Updated Vital Signs BP (!) 145/67   Pulse (!) 106   Temp 98.3 F (36.8 C) (Oral)   Resp (!) 24   Ht 5\' 11"  (1.803 m)   Wt (!) 158.8 kg   SpO2 93%   BMI 48.82 kg/m  Physical Exam Vitals and nursing note reviewed.  Constitutional:      General: He is not in acute distress.    Appearance: He is well-developed. He is obese.     Comments: Mild dyspnea with conversation  HENT:     Head: Normocephalic and atraumatic.     Mouth/Throat:     Pharynx: No oropharyngeal exudate.  Eyes:     Conjunctiva/sclera: Conjunctivae normal.     Pupils: Pupils are equal, round, and reactive to light.  Neck:     Comments: No meningismus. Cardiovascular:     Rate and Rhythm: Normal rate and regular rhythm.     Heart sounds: Normal heart sounds. No murmur heard. Pulmonary:     Effort: Pulmonary effort is normal. No respiratory  distress.     Breath sounds: Wheezing present.     Comments: Scatter expiratory wheezing bilaterally Abdominal:     Palpations: Abdomen is soft.     Tenderness: There is no abdominal tenderness. There is no guarding or rebound.  Musculoskeletal:        General: No tenderness. Normal range of motion.     Cervical back: Normal range of motion and neck supple.  Skin:    General: Skin is warm.  Neurological:     Mental Status: He is alert and oriented to person, place, and time.     Cranial Nerves: No cranial nerve deficit.     Motor: No abnormal muscle tone.     Coordination: Coordination normal.     Comments:  5/5 strength throughout. CN 2-12 intact.Equal grip strength.   Psychiatric:        Behavior: Behavior normal.     ED Results / Procedures /  Treatments   Labs (all labs ordered are listed, but only abnormal results are displayed) Labs Reviewed  RESP PANEL BY RT-PCR (RSV, FLU A&B, COVID)  RVPGX2 - Abnormal; Notable for the following components:      Result Value   Influenza A by PCR POSITIVE (*)    All other components within normal limits  BASIC METABOLIC PANEL - Abnormal; Notable for the following components:   Sodium 131 (*)    Chloride 96 (*)    Glucose, Bld 167 (*)    All other components within normal limits  D-DIMER, QUANTITATIVE - Abnormal; Notable for the following components:   D-Dimer, Quant 0.51 (*)    All other components within normal limits  CBC  TROPONIN I (HIGH SENSITIVITY)  TROPONIN I (HIGH SENSITIVITY)    EKG EKG Interpretation Date/Time:  Thursday August 04 2023 23:16:36 EST Ventricular Rate:  112 PR Interval:  140 QRS Duration:  96 QT Interval:  322 QTC Calculation: 439 R Axis:   89  Text Interpretation: Sinus tachycardia Otherwise normal ECG When compared with ECG of 19-Nov-2020 17:06, PREVIOUS ECG IS PRESENT Rate faster Confirmed by Glynn Octave [phone]) on 08/05/2023 3:39:45 AM  Radiology CT Angio Chest PE W and/or Wo Contrast Result Date: 08/05/2023 CLINICAL DATA:  Pulmonary embolism suspected, low to intermediate probability. Positive D-dimer. EXAM: CT ANGIOGRAPHY CHEST WITH CONTRAST TECHNIQUE: Multidetector CT imaging of the chest was performed using the standard protocol during bolus administration of intravenous contrast. Multiplanar CT image reconstructions and MIPs were obtained to evaluate the vascular anatomy. RADIATION DOSE REDUCTION: This exam was performed according to the departmental dose-optimization program which includes automated exposure control, adjustment of the mA and/or kV according to patient size and/or use of iterative reconstruction technique. CONTRAST:  80mL OMNIPAQUE IOHEXOL 350 MG/ML SOLN COMPARISON:  None Available. FINDINGS: Cardiovascular: Satisfactory  opacification of the pulmonary arteries to the segmental level. No evidence of pulmonary embolism. Normal heart size. No pericardial effusion. Mediastinum/Nodes: Negative for mass or adenopathy Lungs/Pleura: Generalized airway thickening. There is no edema, consolidation, effusion, or pneumothorax. Upper Abdomen: Suspect marked hepatic steatosis, convincing even when accounting for post contrast timing. Musculoskeletal: Negative Review of the MIP images confirms the above findings. IMPRESSION: Negative for pulmonary embolism. Bronchitic airway thickening without pneumonia. Electronically Signed   By: Tiburcio Pea M.D.   On: 08/05/2023 05:13   DG Chest 2 View Result Date: 08/04/2023 CLINICAL DATA:  Fever, congestion, cough and nausea. EXAM: CHEST - 2 VIEW COMPARISON:  November 19, 2020 FINDINGS: The heart size and mediastinal contours are within  normal limits. There is no evidence of an acute infiltrate, pleural effusion or pneumothorax. The visualized skeletal structures are unremarkable. IMPRESSION: No active cardiopulmonary disease. Electronically Signed   By: Aram Candela M.D.   On: 08/04/2023 23:40    Procedures Procedures    Medications Ordered in ED Medications  ipratropium-albuterol (DUONEB) 0.5-2.5 (3) MG/3ML nebulizer solution 3 mL (has no administration in time range)    ED Course/ Medical Decision Making/ A&P                                 Medical Decision Making Amount and/or Complexity of Data Reviewed Labs: ordered. Decision-making details documented in ED Course. Radiology: ordered and independent interpretation performed. Decision-making details documented in ED Course. ECG/medicine tests: ordered and independent interpretation performed. Decision-making details documented in ED Course.  Risk Prescription drug management.   2 days of URI symptoms with cough, congestion, fever, chest pain with coughing.  Tachycardic and tachypneic on arrival but diminished with his  expiratory wheezing.  Bronchodilators to be given.  Influenza swab is positive.  EKG is unchanged sinus rhythm without acute ST changes.  Chest x-ray negative for infiltrate.  Results reviewed and interpreted by me  Symptoms Likely secondary to influenza.  Troponin negative x 2 with low suspicion for ACS.  D-dimer slightly elevated but CT pulmonary embolism study is negative.  Patient able to ambulate without desaturation.  Heart rate has improved to the 100s. Remains slightly tachycardic likely secondary to albuterol use.  Will treat supportively for influenza with p.o. hydration, antipyretics and Tamiflu.  Patient tolerating p.o., ambulatory without hypoxia.  Work of breathing has improved.  Discussed p.o. hydration, antipyretics and Tamiflu for home use. Return precautions discussed.       Final Clinical Impression(s) / ED Diagnoses Final diagnoses:  Influenza  Atypical chest pain    Rx / DC Orders ED Discharge Orders     None         Kamdyn Colborn, Jeannett Senior, MD 08/05/23 [id number]
